# Patient Record
Sex: Female | Born: 1945 | Race: White | Hispanic: No | Marital: Married
Health system: Southern US, Community
[De-identification: ages and names within clinical notes are randomized; demographics above are authoritative.]

---

## 2000-01-30 ENCOUNTER — Other Ambulatory Visit: Admission: RE | Admit: 2000-01-30 | Discharge: 2000-01-30 | Payer: Self-pay | Admitting: Obstetrics and Gynecology

## 2000-09-09 ENCOUNTER — Encounter: Admission: RE | Admit: 2000-09-09 | Discharge: 2000-09-09 | Payer: Self-pay | Admitting: Obstetrics and Gynecology

## 2000-09-09 ENCOUNTER — Encounter: Payer: Self-pay | Admitting: Obstetrics and Gynecology

## 2001-02-24 ENCOUNTER — Other Ambulatory Visit: Admission: RE | Admit: 2001-02-24 | Discharge: 2001-02-24 | Payer: Self-pay | Admitting: Obstetrics and Gynecology

## 2002-05-01 ENCOUNTER — Other Ambulatory Visit: Admission: RE | Admit: 2002-05-01 | Discharge: 2002-05-01 | Payer: Self-pay | Admitting: Gynecology

## 2002-05-09 ENCOUNTER — Encounter: Admission: RE | Admit: 2002-05-09 | Discharge: 2002-05-09 | Payer: Self-pay | Admitting: Gynecology

## 2002-05-09 ENCOUNTER — Encounter: Payer: Self-pay | Admitting: Gynecology

## 2002-05-22 ENCOUNTER — Encounter: Admission: RE | Admit: 2002-05-22 | Discharge: 2002-05-22 | Payer: Self-pay | Admitting: Gynecology

## 2002-05-22 ENCOUNTER — Encounter: Payer: Self-pay | Admitting: Gynecology

## 2002-07-04 ENCOUNTER — Ambulatory Visit (HOSPITAL_COMMUNITY): Admission: RE | Admit: 2002-07-04 | Discharge: 2002-07-04 | Payer: Self-pay | Admitting: *Deleted

## 2003-05-16 ENCOUNTER — Other Ambulatory Visit: Admission: RE | Admit: 2003-05-16 | Discharge: 2003-05-16 | Payer: Self-pay | Admitting: Gynecology

## 2004-06-03 ENCOUNTER — Encounter: Admission: RE | Admit: 2004-06-03 | Discharge: 2004-06-03 | Payer: Self-pay | Admitting: Gynecology

## 2004-06-18 ENCOUNTER — Other Ambulatory Visit: Admission: RE | Admit: 2004-06-18 | Discharge: 2004-06-18 | Payer: Self-pay | Admitting: Gynecology

## 2005-08-18 ENCOUNTER — Other Ambulatory Visit: Admission: RE | Admit: 2005-08-18 | Discharge: 2005-08-18 | Payer: Self-pay | Admitting: Gynecology

## 2006-09-08 ENCOUNTER — Other Ambulatory Visit: Admission: RE | Admit: 2006-09-08 | Discharge: 2006-09-08 | Payer: Self-pay | Admitting: Gynecology

## 2006-09-09 ENCOUNTER — Encounter: Admission: RE | Admit: 2006-09-09 | Discharge: 2006-09-09 | Payer: Self-pay | Admitting: Gynecology

## 2008-01-05 ENCOUNTER — Encounter: Admission: RE | Admit: 2008-01-05 | Discharge: 2008-01-05 | Payer: Self-pay | Admitting: Gynecology

## 2008-01-13 ENCOUNTER — Encounter: Admission: RE | Admit: 2008-01-13 | Discharge: 2008-01-13 | Payer: Self-pay | Admitting: Gynecology

## 2009-07-11 ENCOUNTER — Encounter: Admission: RE | Admit: 2009-07-11 | Discharge: 2009-07-11 | Payer: Self-pay | Admitting: Gynecology

## 2010-12-04 ENCOUNTER — Encounter
Admission: RE | Admit: 2010-12-04 | Discharge: 2010-12-04 | Payer: Self-pay | Source: Home / Self Care | Attending: Gynecology | Admitting: Gynecology

## 2011-04-03 NOTE — Op Note (Signed)
   NAME:  Mallory Lee, Mallory Lee                            ACCOUNT NO.:  [id number]   MEDICAL RECORD NO.:  [id number]                   PATIENT TYPE:  AMB   LOCATION:  ENDO                                 FACILITY:  [facility]   PHYSICIAN:  Georgiana Spinner, M.D.                 DATE OF BIRTH:  September 04, 1946   DATE OF PROCEDURE:  07/04/2002  DATE OF DISCHARGE:                                 OPERATIVE REPORT   PROCEDURE PERFORMED:  Colonoscopy.   ENDOSCOPIST:  Georgiana Spinner, M.D.   INDICATIONS FOR PROCEDURE:  Hemoccult positivity.   ANESTHESIA:  None further given.  See endoscopy note.   PREP:  Visicol tablets.  Result was very good.   DESCRIPTION OF PROCEDURE:  With the patient mildly sedated in the left  lateral decubitus the Olympus video colonoscope was inserted in the rectum  and passed under direct vision to the cecum, identified by the ileocecal  valve and appendiceal orifice, both of which were photographed.  From this  point the colonoscope was slowly withdrawn taking circumferential views of  the entire colonic mucosa stopping only in the rectum which appeared normal  on direct and showed hemorrhoids on retroflex view.  The endoscope was  straightened and withdrawn.  The patient's vital signs and pulse oximeter  remained stable.  The patient tolerated the procedure well without apparent  complications.   FINDINGS:  Internal hemorrhoids.  Otherwise unremarkable examination.   PLAN:  Have patient follow-up with me in five years or as needed.                                               Georgiana Spinner, M.D.    GMO/MEDQ  D:  07/04/2002  T:  07/05/2002  Job:  [id number]   cc:   Gretta Cool, M.D.

## 2011-04-03 NOTE — Op Note (Signed)
   NAME:  Mallory Lee, Mallory Lee                            ACCOUNT NO.:  [id number]   MEDICAL RECORD NO.:  [id number]                   PATIENT TYPE:  AMB   LOCATION:  ENDO                                 FACILITY:  [facility]   PHYSICIAN:  Georgiana Spinner, M.D.                 DATE OF BIRTH:  1946/09/27   DATE OF PROCEDURE:  11/15/2002  DATE OF DISCHARGE:                                 OPERATIVE REPORT   PROCEDURE PERFORMED:  Upper endoscopy.   ENDOSCOPIST:  Georgiana Spinner, M.D.   INDICATIONS FOR PROCEDURE:  Gastroesophageal reflux disease, Hemoccult  positivity.   ANESTHESIA:  Demerol  60 mg, Versed 6 mg.   DESCRIPTION OF PROCEDURE:  With the patient mildly sedated in the left  lateral decubitus position, the Olympus video endoscope was inserted in the  mouth and passed under direct vision through the esophagus which appeared  normal.  There was no evidence of Barrett's.  On entering into the stomach  through a  hiatal hernia, the fundus, body, antrum, duodenal bulb and second  portion of the duodenum all appeared normal.  From this point, the endoscope  was slowly withdrawn taking circumferential views of the entire duodenal  mucosa until the endoscope was pulled back into the stomach and placed on  retroflexion to view the stomach from below.  The endoscope was then  straightened and withdrawn taking circumferential views of the remaining  gastric and esophageal mucosa.  The patient's vital signs and pulse oximeter  remained stable.  The patient tolerated the procedure well without apparent  complications.   FINDINGS:  Hiatal hernia.  Otherwise unremarkable examination.   PLAN:  Proceed to colonoscopy.                                               Georgiana Spinner, M.D.    GMO/MEDQ  D:  07/04/2002  T:  07/05/2002  Job:  [id number]

## 2013-11-02 ENCOUNTER — Other Ambulatory Visit: Payer: Self-pay

## 2013-11-02 DIAGNOSIS — Z1231 Encounter for screening mammogram for malignant neoplasm of breast: Secondary | ICD-10-CM

## 2013-11-14 ENCOUNTER — Ambulatory Visit: Payer: Self-pay

## 2013-11-15 ENCOUNTER — Ambulatory Visit
Admission: RE | Admit: 2013-11-15 | Discharge: 2013-11-15 | Disposition: A | Discharge status: Home / Self Care | Payer: Medicare Other | Source: Ambulatory Visit

## 2013-11-15 DIAGNOSIS — Z1231 Encounter for screening mammogram for malignant neoplasm of breast: Secondary | ICD-10-CM

## 2015-04-22 IMAGING — MG MM DIGITAL SCREENING BILATERAL
4 series · 4 of 4 positions shown · non-contrast
Comparison: Previous exam(s).

CLINICAL DATA: Screening.

EXAM:
DIGITAL SCREENING BILATERAL MAMMOGRAM WITH CAD

[R CC]
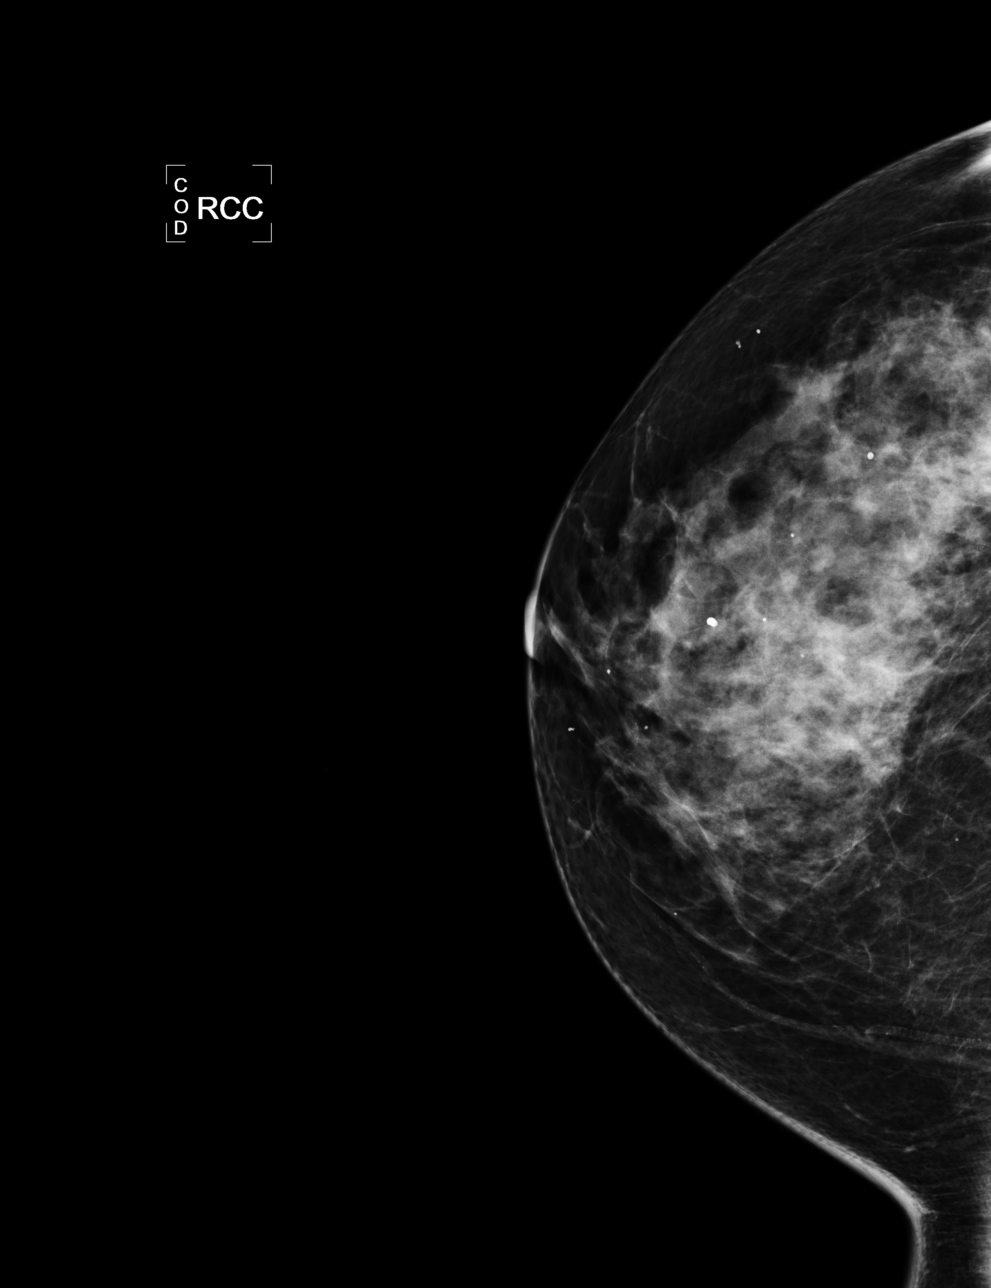

[L CC]
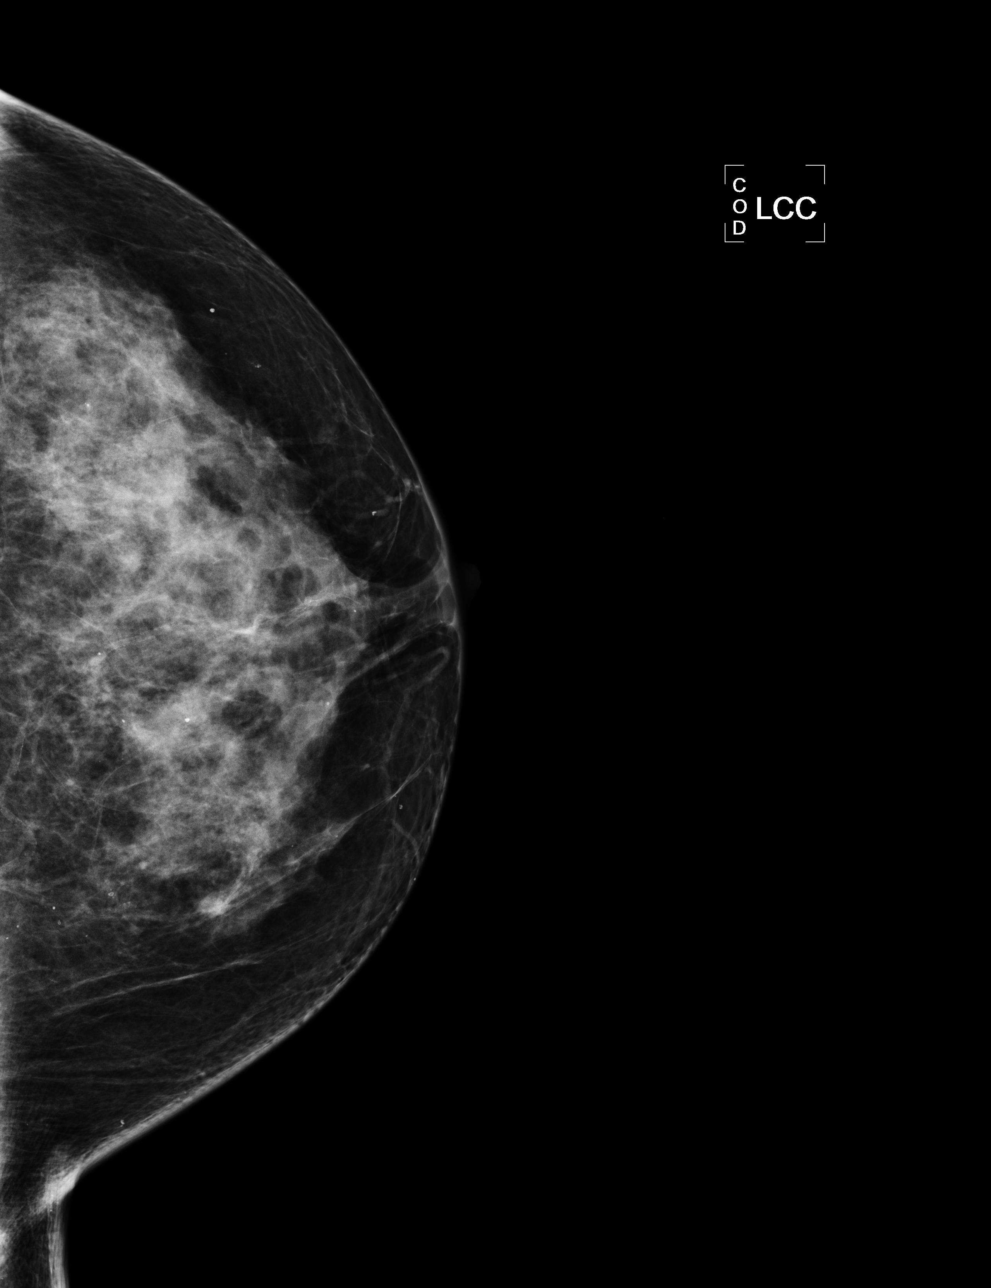

[L MLO]
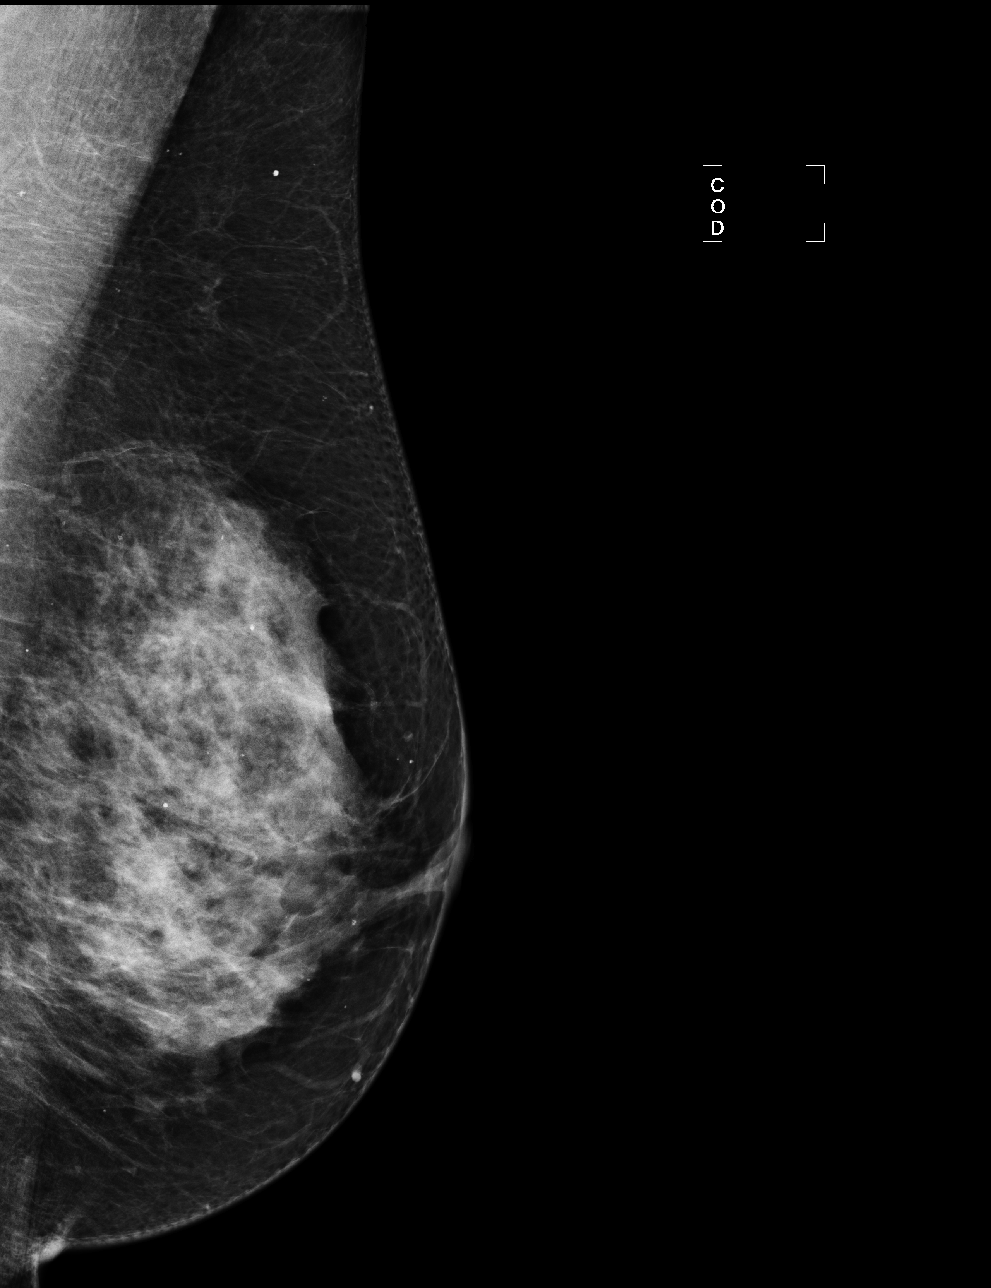

[R MLO]
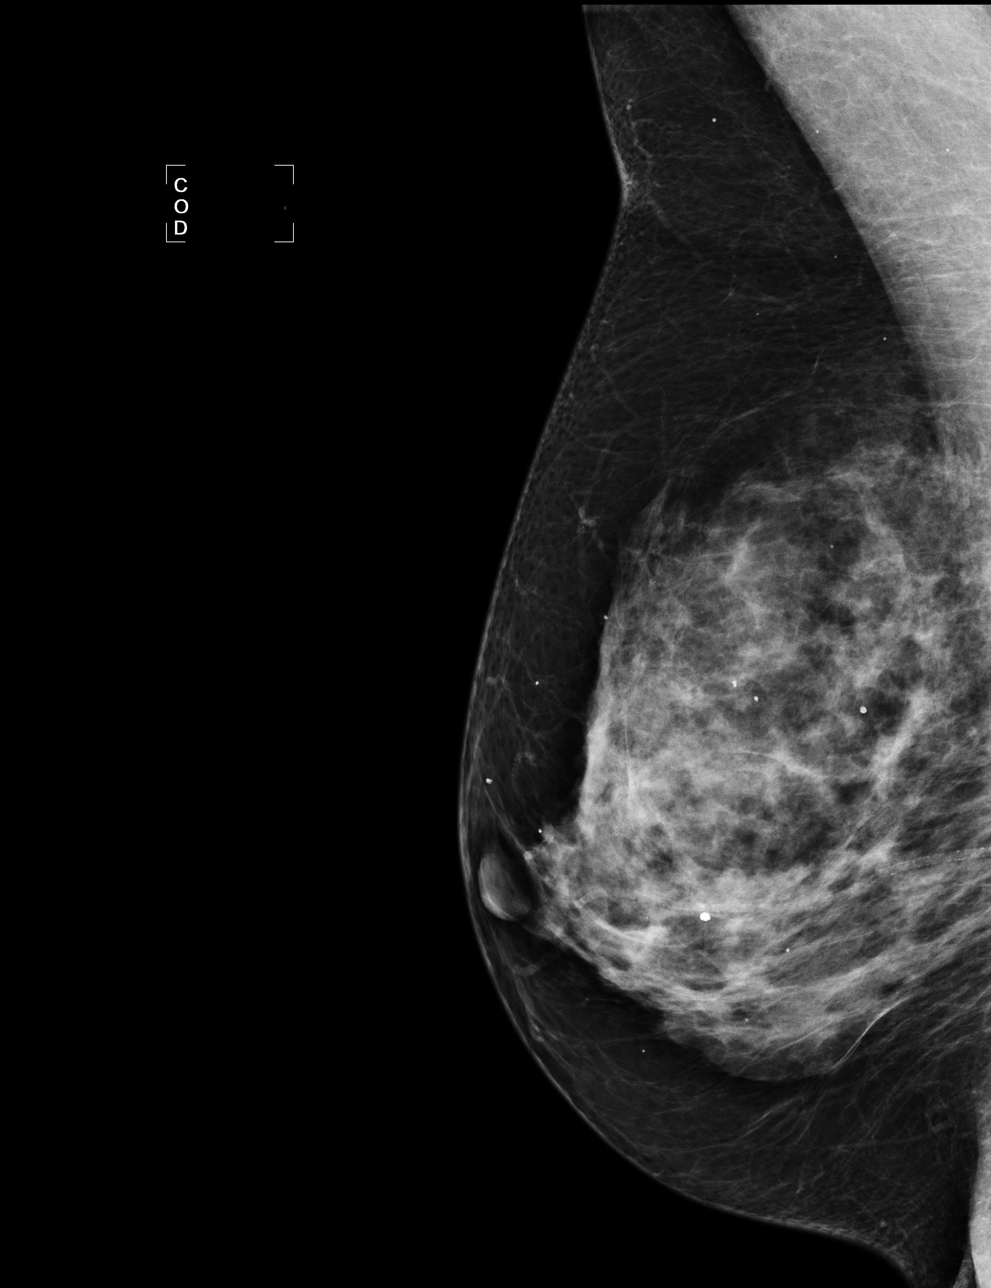

[4 of 4 positions shown; findings below may reference images not displayed]

ACR Breast Density Category d: The breasts are extremely dense,
which lowers the sensitivity of mammography.
FINDINGS: There are no findings suspicious for malignancy. Images were
processed with CAD.
IMPRESSION: No mammographic evidence of malignancy. A result letter of this
screening mammogram will be mailed directly to the patient.

RECOMMENDATION:
Screening mammogram in one year. (Code:[id number]-K-7LZ)

BI-RADS CATEGORY  1: Negative

## 2016-01-15 DIAGNOSIS — L821 Other seborrheic keratosis: Secondary | ICD-10-CM | POA: Diagnosis not present

## 2016-01-15 DIAGNOSIS — D1801 Hemangioma of skin and subcutaneous tissue: Secondary | ICD-10-CM | POA: Diagnosis not present

## 2016-01-15 DIAGNOSIS — L820 Inflamed seborrheic keratosis: Secondary | ICD-10-CM | POA: Diagnosis not present

## 2016-01-15 DIAGNOSIS — L578 Other skin changes due to chronic exposure to nonionizing radiation: Secondary | ICD-10-CM | POA: Diagnosis not present

## 2016-02-11 DIAGNOSIS — L308 Other specified dermatitis: Secondary | ICD-10-CM | POA: Diagnosis not present

## 2016-02-11 DIAGNOSIS — L821 Other seborrheic keratosis: Secondary | ICD-10-CM | POA: Diagnosis not present

## 2016-02-11 DIAGNOSIS — B029 Zoster without complications: Secondary | ICD-10-CM | POA: Diagnosis not present

## 2016-02-17 DIAGNOSIS — E789 Disorder of lipoprotein metabolism, unspecified: Secondary | ICD-10-CM | POA: Diagnosis not present

## 2016-02-17 DIAGNOSIS — F419 Anxiety disorder, unspecified: Secondary | ICD-10-CM | POA: Diagnosis not present

## 2016-02-17 DIAGNOSIS — E032 Hypothyroidism due to medicaments and other exogenous substances: Secondary | ICD-10-CM | POA: Diagnosis not present

## 2016-02-17 DIAGNOSIS — I10 Essential (primary) hypertension: Secondary | ICD-10-CM | POA: Diagnosis not present

## 2016-09-03 DIAGNOSIS — Z4789 Encounter for other orthopedic aftercare: Secondary | ICD-10-CM | POA: Diagnosis not present

## 2016-09-03 DIAGNOSIS — M19041 Primary osteoarthritis, right hand: Secondary | ICD-10-CM | POA: Diagnosis not present

## 2016-09-09 DIAGNOSIS — M25611 Stiffness of right shoulder, not elsewhere classified: Secondary | ICD-10-CM | POA: Diagnosis not present

## 2016-09-11 DIAGNOSIS — M25611 Stiffness of right shoulder, not elsewhere classified: Secondary | ICD-10-CM | POA: Diagnosis not present

## 2016-09-15 DIAGNOSIS — M25611 Stiffness of right shoulder, not elsewhere classified: Secondary | ICD-10-CM | POA: Diagnosis not present

## 2016-09-17 DIAGNOSIS — M25611 Stiffness of right shoulder, not elsewhere classified: Secondary | ICD-10-CM | POA: Diagnosis not present

## 2016-09-22 DIAGNOSIS — M25611 Stiffness of right shoulder, not elsewhere classified: Secondary | ICD-10-CM | POA: Diagnosis not present

## 2016-09-24 DIAGNOSIS — M25611 Stiffness of right shoulder, not elsewhere classified: Secondary | ICD-10-CM | POA: Diagnosis not present

## 2016-09-30 DIAGNOSIS — M25611 Stiffness of right shoulder, not elsewhere classified: Secondary | ICD-10-CM | POA: Diagnosis not present

## 2016-10-01 DIAGNOSIS — M25511 Pain in right shoulder: Secondary | ICD-10-CM | POA: Diagnosis not present

## 2016-10-01 DIAGNOSIS — F419 Anxiety disorder, unspecified: Secondary | ICD-10-CM | POA: Diagnosis not present

## 2016-10-01 DIAGNOSIS — Z4789 Encounter for other orthopedic aftercare: Secondary | ICD-10-CM | POA: Diagnosis not present

## 2016-10-01 DIAGNOSIS — F329 Major depressive disorder, single episode, unspecified: Secondary | ICD-10-CM | POA: Diagnosis not present

## 2016-10-01 DIAGNOSIS — G8929 Other chronic pain: Secondary | ICD-10-CM | POA: Diagnosis not present

## 2016-10-01 DIAGNOSIS — J329 Chronic sinusitis, unspecified: Secondary | ICD-10-CM | POA: Diagnosis not present

## 2016-10-02 DIAGNOSIS — M25611 Stiffness of right shoulder, not elsewhere classified: Secondary | ICD-10-CM | POA: Diagnosis not present

## 2016-10-15 DIAGNOSIS — M25611 Stiffness of right shoulder, not elsewhere classified: Secondary | ICD-10-CM | POA: Diagnosis not present

## 2016-10-20 DIAGNOSIS — M25611 Stiffness of right shoulder, not elsewhere classified: Secondary | ICD-10-CM | POA: Diagnosis not present

## 2016-10-22 DIAGNOSIS — M25611 Stiffness of right shoulder, not elsewhere classified: Secondary | ICD-10-CM | POA: Diagnosis not present

## 2016-10-26 DIAGNOSIS — M25611 Stiffness of right shoulder, not elsewhere classified: Secondary | ICD-10-CM | POA: Diagnosis not present

## 2016-10-28 DIAGNOSIS — M25611 Stiffness of right shoulder, not elsewhere classified: Secondary | ICD-10-CM | POA: Diagnosis not present

## 2016-11-05 DIAGNOSIS — M25611 Stiffness of right shoulder, not elsewhere classified: Secondary | ICD-10-CM | POA: Diagnosis not present

## 2016-11-17 DIAGNOSIS — I10 Essential (primary) hypertension: Secondary | ICD-10-CM | POA: Diagnosis not present

## 2016-11-17 DIAGNOSIS — E032 Hypothyroidism due to medicaments and other exogenous substances: Secondary | ICD-10-CM | POA: Diagnosis not present

## 2016-11-17 DIAGNOSIS — F419 Anxiety disorder, unspecified: Secondary | ICD-10-CM | POA: Diagnosis not present

## 2016-11-17 DIAGNOSIS — E789 Disorder of lipoprotein metabolism, unspecified: Secondary | ICD-10-CM | POA: Diagnosis not present

## 2017-01-14 DIAGNOSIS — L814 Other melanin hyperpigmentation: Secondary | ICD-10-CM | POA: Diagnosis not present

## 2017-01-14 DIAGNOSIS — L820 Inflamed seborrheic keratosis: Secondary | ICD-10-CM | POA: Diagnosis not present

## 2017-01-14 DIAGNOSIS — D1801 Hemangioma of skin and subcutaneous tissue: Secondary | ICD-10-CM | POA: Diagnosis not present

## 2017-02-25 DIAGNOSIS — E789 Disorder of lipoprotein metabolism, unspecified: Secondary | ICD-10-CM | POA: Diagnosis not present

## 2017-02-25 DIAGNOSIS — I10 Essential (primary) hypertension: Secondary | ICD-10-CM | POA: Diagnosis not present

## 2017-02-25 DIAGNOSIS — E032 Hypothyroidism due to medicaments and other exogenous substances: Secondary | ICD-10-CM | POA: Diagnosis not present

## 2017-03-11 DIAGNOSIS — E789 Disorder of lipoprotein metabolism, unspecified: Secondary | ICD-10-CM | POA: Diagnosis not present

## 2017-03-11 DIAGNOSIS — E559 Vitamin D deficiency, unspecified: Secondary | ICD-10-CM | POA: Diagnosis not present

## 2017-03-11 DIAGNOSIS — Z0000 Encounter for general adult medical examination without abnormal findings: Secondary | ICD-10-CM | POA: Diagnosis not present

## 2017-03-11 DIAGNOSIS — I10 Essential (primary) hypertension: Secondary | ICD-10-CM | POA: Diagnosis not present

## 2017-03-11 DIAGNOSIS — E0789 Other specified disorders of thyroid: Secondary | ICD-10-CM | POA: Diagnosis not present

## 2017-03-11 DIAGNOSIS — Z79899 Other long term (current) drug therapy: Secondary | ICD-10-CM | POA: Diagnosis not present

## 2017-04-08 DIAGNOSIS — J069 Acute upper respiratory infection, unspecified: Secondary | ICD-10-CM | POA: Diagnosis not present

## 2017-04-08 DIAGNOSIS — B9789 Other viral agents as the cause of diseases classified elsewhere: Secondary | ICD-10-CM | POA: Diagnosis not present

## 2017-06-28 DIAGNOSIS — Z23 Encounter for immunization: Secondary | ICD-10-CM | POA: Diagnosis not present

## 2017-06-28 DIAGNOSIS — E039 Hypothyroidism, unspecified: Secondary | ICD-10-CM | POA: Diagnosis not present

## 2017-06-28 DIAGNOSIS — E559 Vitamin D deficiency, unspecified: Secondary | ICD-10-CM | POA: Diagnosis not present

## 2017-06-28 DIAGNOSIS — F419 Anxiety disorder, unspecified: Secondary | ICD-10-CM | POA: Diagnosis not present

## 2017-06-28 DIAGNOSIS — I10 Essential (primary) hypertension: Secondary | ICD-10-CM | POA: Diagnosis not present

## 2017-06-28 DIAGNOSIS — E7800 Pure hypercholesterolemia, unspecified: Secondary | ICD-10-CM | POA: Diagnosis not present

## 2017-07-20 DIAGNOSIS — Z01419 Encounter for gynecological examination (general) (routine) without abnormal findings: Secondary | ICD-10-CM | POA: Diagnosis not present

## 2017-07-20 DIAGNOSIS — Z780 Asymptomatic menopausal state: Secondary | ICD-10-CM | POA: Diagnosis not present

## 2017-07-20 DIAGNOSIS — Z79890 Hormone replacement therapy (postmenopausal): Secondary | ICD-10-CM | POA: Diagnosis not present

## 2017-07-20 DIAGNOSIS — Z1231 Encounter for screening mammogram for malignant neoplasm of breast: Secondary | ICD-10-CM | POA: Diagnosis not present

## 2017-08-23 DIAGNOSIS — Z1211 Encounter for screening for malignant neoplasm of colon: Secondary | ICD-10-CM | POA: Diagnosis not present

## 2017-08-23 DIAGNOSIS — D126 Benign neoplasm of colon, unspecified: Secondary | ICD-10-CM | POA: Diagnosis not present

## 2017-08-23 DIAGNOSIS — K640 First degree hemorrhoids: Secondary | ICD-10-CM | POA: Diagnosis not present

## 2017-08-23 DIAGNOSIS — K6389 Other specified diseases of intestine: Secondary | ICD-10-CM | POA: Diagnosis not present

## 2017-08-23 DIAGNOSIS — K5730 Diverticulosis of large intestine without perforation or abscess without bleeding: Secondary | ICD-10-CM | POA: Diagnosis not present

## 2017-08-26 DIAGNOSIS — D126 Benign neoplasm of colon, unspecified: Secondary | ICD-10-CM | POA: Diagnosis not present

## 2017-12-09 DIAGNOSIS — L820 Inflamed seborrheic keratosis: Secondary | ICD-10-CM | POA: Diagnosis not present

## 2017-12-09 DIAGNOSIS — C441191 Basal cell carcinoma of skin of left upper eyelid, including canthus: Secondary | ICD-10-CM | POA: Diagnosis not present

## 2017-12-09 DIAGNOSIS — R233 Spontaneous ecchymoses: Secondary | ICD-10-CM | POA: Diagnosis not present

## 2017-12-24 DIAGNOSIS — E7800 Pure hypercholesterolemia, unspecified: Secondary | ICD-10-CM | POA: Diagnosis not present

## 2017-12-24 DIAGNOSIS — E559 Vitamin D deficiency, unspecified: Secondary | ICD-10-CM | POA: Diagnosis not present

## 2017-12-24 DIAGNOSIS — Z114 Encounter for screening for human immunodeficiency virus [HIV]: Secondary | ICD-10-CM | POA: Diagnosis not present

## 2017-12-29 ENCOUNTER — Other Ambulatory Visit (HOSPITAL_COMMUNITY): Payer: Self-pay | Admitting: Respiratory Therapy

## 2017-12-29 DIAGNOSIS — R05 Cough: Secondary | ICD-10-CM

## 2017-12-29 DIAGNOSIS — Z23 Encounter for immunization: Secondary | ICD-10-CM | POA: Diagnosis not present

## 2017-12-29 DIAGNOSIS — R059 Cough, unspecified: Secondary | ICD-10-CM

## 2017-12-29 DIAGNOSIS — I10 Essential (primary) hypertension: Secondary | ICD-10-CM | POA: Diagnosis not present

## 2018-01-07 ENCOUNTER — Inpatient Hospital Stay (HOSPITAL_COMMUNITY): Admission: RE | Admit: 2018-01-07 | Payer: Self-pay | Source: Ambulatory Visit

## 2018-03-30 DIAGNOSIS — L578 Other skin changes due to chronic exposure to nonionizing radiation: Secondary | ICD-10-CM | POA: Diagnosis not present

## 2018-03-30 DIAGNOSIS — L820 Inflamed seborrheic keratosis: Secondary | ICD-10-CM | POA: Diagnosis not present

## 2018-03-30 DIAGNOSIS — D1801 Hemangioma of skin and subcutaneous tissue: Secondary | ICD-10-CM | POA: Diagnosis not present

## 2018-03-30 DIAGNOSIS — L814 Other melanin hyperpigmentation: Secondary | ICD-10-CM | POA: Diagnosis not present

## 2018-07-06 DIAGNOSIS — E7800 Pure hypercholesterolemia, unspecified: Secondary | ICD-10-CM | POA: Diagnosis not present

## 2018-07-06 DIAGNOSIS — I10 Essential (primary) hypertension: Secondary | ICD-10-CM | POA: Diagnosis not present

## 2018-07-13 DIAGNOSIS — Z86010 Personal history of colonic polyps: Secondary | ICD-10-CM | POA: Diagnosis not present

## 2018-07-13 DIAGNOSIS — Z0000 Encounter for general adult medical examination without abnormal findings: Secondary | ICD-10-CM | POA: Diagnosis not present

## 2018-07-13 DIAGNOSIS — Z23 Encounter for immunization: Secondary | ICD-10-CM | POA: Diagnosis not present

## 2018-07-13 DIAGNOSIS — Z780 Asymptomatic menopausal state: Secondary | ICD-10-CM | POA: Diagnosis not present

## 2018-07-13 DIAGNOSIS — I10 Essential (primary) hypertension: Secondary | ICD-10-CM | POA: Diagnosis not present

## 2018-07-13 DIAGNOSIS — M810 Age-related osteoporosis without current pathological fracture: Secondary | ICD-10-CM | POA: Diagnosis not present

## 2018-07-13 DIAGNOSIS — E039 Hypothyroidism, unspecified: Secondary | ICD-10-CM | POA: Diagnosis not present

## 2018-07-13 DIAGNOSIS — F419 Anxiety disorder, unspecified: Secondary | ICD-10-CM | POA: Diagnosis not present

## 2018-07-13 DIAGNOSIS — E789 Disorder of lipoprotein metabolism, unspecified: Secondary | ICD-10-CM | POA: Diagnosis not present

## 2018-07-13 DIAGNOSIS — E7800 Pure hypercholesterolemia, unspecified: Secondary | ICD-10-CM | POA: Diagnosis not present

## 2018-11-15 DIAGNOSIS — L820 Inflamed seborrheic keratosis: Secondary | ICD-10-CM | POA: Diagnosis not present

## 2018-11-15 DIAGNOSIS — L821 Other seborrheic keratosis: Secondary | ICD-10-CM | POA: Diagnosis not present

## 2018-11-15 DIAGNOSIS — L578 Other skin changes due to chronic exposure to nonionizing radiation: Secondary | ICD-10-CM | POA: Diagnosis not present

## 2018-12-29 DIAGNOSIS — I10 Essential (primary) hypertension: Secondary | ICD-10-CM | POA: Diagnosis not present

## 2019-03-06 DIAGNOSIS — J018 Other acute sinusitis: Secondary | ICD-10-CM | POA: Diagnosis not present

## 2019-04-26 DIAGNOSIS — J3489 Other specified disorders of nose and nasal sinuses: Secondary | ICD-10-CM | POA: Diagnosis not present

## 2019-04-26 DIAGNOSIS — R05 Cough: Secondary | ICD-10-CM | POA: Diagnosis not present

## 2019-04-26 DIAGNOSIS — J392 Other diseases of pharynx: Secondary | ICD-10-CM | POA: Diagnosis not present

## 2019-04-26 DIAGNOSIS — J040 Acute laryngitis: Secondary | ICD-10-CM | POA: Diagnosis not present

## 2019-04-26 DIAGNOSIS — Z03818 Encounter for observation for suspected exposure to other biological agents ruled out: Secondary | ICD-10-CM | POA: Diagnosis not present

## 2019-04-26 DIAGNOSIS — R0981 Nasal congestion: Secondary | ICD-10-CM | POA: Diagnosis not present

## 2019-06-21 DIAGNOSIS — H9202 Otalgia, left ear: Secondary | ICD-10-CM | POA: Diagnosis not present

## 2019-06-21 DIAGNOSIS — J040 Acute laryngitis: Secondary | ICD-10-CM | POA: Diagnosis not present

## 2019-07-17 DIAGNOSIS — I10 Essential (primary) hypertension: Secondary | ICD-10-CM | POA: Diagnosis not present

## 2019-07-17 DIAGNOSIS — E032 Hypothyroidism due to medicaments and other exogenous substances: Secondary | ICD-10-CM | POA: Diagnosis not present

## 2019-07-17 DIAGNOSIS — E7800 Pure hypercholesterolemia, unspecified: Secondary | ICD-10-CM | POA: Diagnosis not present

## 2019-07-20 DIAGNOSIS — S92425A Nondisplaced fracture of distal phalanx of left great toe, initial encounter for closed fracture: Secondary | ICD-10-CM | POA: Diagnosis not present

## 2019-07-20 DIAGNOSIS — I10 Essential (primary) hypertension: Secondary | ICD-10-CM | POA: Diagnosis not present

## 2019-07-20 DIAGNOSIS — Z0001 Encounter for general adult medical examination with abnormal findings: Secondary | ICD-10-CM | POA: Diagnosis not present

## 2019-07-20 DIAGNOSIS — Z86010 Personal history of colonic polyps: Secondary | ICD-10-CM | POA: Diagnosis not present

## 2019-07-20 DIAGNOSIS — E039 Hypothyroidism, unspecified: Secondary | ICD-10-CM | POA: Diagnosis not present

## 2019-07-20 DIAGNOSIS — E789 Disorder of lipoprotein metabolism, unspecified: Secondary | ICD-10-CM | POA: Diagnosis not present

## 2019-07-20 DIAGNOSIS — M79671 Pain in right foot: Secondary | ICD-10-CM | POA: Diagnosis not present

## 2019-07-20 DIAGNOSIS — Z23 Encounter for immunization: Secondary | ICD-10-CM | POA: Diagnosis not present

## 2019-08-02 DIAGNOSIS — S99922A Unspecified injury of left foot, initial encounter: Secondary | ICD-10-CM | POA: Diagnosis not present

## 2019-08-03 DIAGNOSIS — I10 Essential (primary) hypertension: Secondary | ICD-10-CM | POA: Diagnosis not present

## 2019-09-12 DIAGNOSIS — L820 Inflamed seborrheic keratosis: Secondary | ICD-10-CM | POA: Diagnosis not present

## 2019-09-12 DIAGNOSIS — D1801 Hemangioma of skin and subcutaneous tissue: Secondary | ICD-10-CM | POA: Diagnosis not present

## 2019-09-12 DIAGNOSIS — R233 Spontaneous ecchymoses: Secondary | ICD-10-CM | POA: Diagnosis not present

## 2019-09-27 DIAGNOSIS — M12811 Other specific arthropathies, not elsewhere classified, right shoulder: Secondary | ICD-10-CM | POA: Diagnosis not present

## 2019-09-27 DIAGNOSIS — M25511 Pain in right shoulder: Secondary | ICD-10-CM | POA: Diagnosis not present

## 2019-10-02 DIAGNOSIS — R52 Pain, unspecified: Secondary | ICD-10-CM | POA: Diagnosis not present

## 2019-12-22 DIAGNOSIS — I10 Essential (primary) hypertension: Secondary | ICD-10-CM | POA: Diagnosis not present

## 2019-12-22 DIAGNOSIS — Z20828 Contact with and (suspected) exposure to other viral communicable diseases: Secondary | ICD-10-CM | POA: Diagnosis not present

## 2019-12-22 DIAGNOSIS — Z03818 Encounter for observation for suspected exposure to other biological agents ruled out: Secondary | ICD-10-CM | POA: Diagnosis not present

## 2020-01-10 DIAGNOSIS — E789 Disorder of lipoprotein metabolism, unspecified: Secondary | ICD-10-CM | POA: Diagnosis not present

## 2020-01-11 DIAGNOSIS — R42 Dizziness and giddiness: Secondary | ICD-10-CM | POA: Diagnosis not present

## 2020-02-29 DIAGNOSIS — L720 Epidermal cyst: Secondary | ICD-10-CM | POA: Diagnosis not present

## 2020-02-29 DIAGNOSIS — L918 Other hypertrophic disorders of the skin: Secondary | ICD-10-CM | POA: Diagnosis not present

## 2020-02-29 DIAGNOSIS — D1801 Hemangioma of skin and subcutaneous tissue: Secondary | ICD-10-CM | POA: Diagnosis not present

## 2020-02-29 DIAGNOSIS — L820 Inflamed seborrheic keratosis: Secondary | ICD-10-CM | POA: Diagnosis not present

## 2020-05-23 DIAGNOSIS — Z79890 Hormone replacement therapy (postmenopausal): Secondary | ICD-10-CM | POA: Diagnosis not present

## 2020-05-23 DIAGNOSIS — N951 Menopausal and female climacteric states: Secondary | ICD-10-CM | POA: Diagnosis not present

## 2020-05-23 DIAGNOSIS — Z01419 Encounter for gynecological examination (general) (routine) without abnormal findings: Secondary | ICD-10-CM | POA: Diagnosis not present

## 2020-05-23 DIAGNOSIS — Z1231 Encounter for screening mammogram for malignant neoplasm of breast: Secondary | ICD-10-CM | POA: Diagnosis not present

## 2020-07-25 DIAGNOSIS — E032 Hypothyroidism due to medicaments and other exogenous substances: Secondary | ICD-10-CM | POA: Diagnosis not present

## 2020-07-25 DIAGNOSIS — I10 Essential (primary) hypertension: Secondary | ICD-10-CM | POA: Diagnosis not present

## 2020-07-25 DIAGNOSIS — E789 Disorder of lipoprotein metabolism, unspecified: Secondary | ICD-10-CM | POA: Diagnosis not present

## 2020-08-01 DIAGNOSIS — E032 Hypothyroidism due to medicaments and other exogenous substances: Secondary | ICD-10-CM | POA: Diagnosis not present

## 2020-08-01 DIAGNOSIS — H43399 Other vitreous opacities, unspecified eye: Secondary | ICD-10-CM | POA: Diagnosis not present

## 2020-08-01 DIAGNOSIS — E789 Disorder of lipoprotein metabolism, unspecified: Secondary | ICD-10-CM | POA: Diagnosis not present

## 2020-08-01 DIAGNOSIS — E039 Hypothyroidism, unspecified: Secondary | ICD-10-CM | POA: Diagnosis not present

## 2020-08-01 DIAGNOSIS — Z0001 Encounter for general adult medical examination with abnormal findings: Secondary | ICD-10-CM | POA: Diagnosis not present

## 2020-08-01 DIAGNOSIS — I10 Essential (primary) hypertension: Secondary | ICD-10-CM | POA: Diagnosis not present

## 2020-08-01 DIAGNOSIS — Z23 Encounter for immunization: Secondary | ICD-10-CM | POA: Diagnosis not present

## 2020-08-01 DIAGNOSIS — R159 Full incontinence of feces: Secondary | ICD-10-CM | POA: Diagnosis not present

## 2020-08-01 DIAGNOSIS — M810 Age-related osteoporosis without current pathological fracture: Secondary | ICD-10-CM | POA: Diagnosis not present

## 2020-08-26 DIAGNOSIS — R42 Dizziness and giddiness: Secondary | ICD-10-CM | POA: Diagnosis not present

## 2020-09-06 DIAGNOSIS — J069 Acute upper respiratory infection, unspecified: Secondary | ICD-10-CM | POA: Diagnosis not present

## 2020-09-06 DIAGNOSIS — R197 Diarrhea, unspecified: Secondary | ICD-10-CM | POA: Diagnosis not present

## 2020-09-06 DIAGNOSIS — Z20822 Contact with and (suspected) exposure to covid-19: Secondary | ICD-10-CM | POA: Diagnosis not present

## 2020-09-25 DIAGNOSIS — E789 Disorder of lipoprotein metabolism, unspecified: Secondary | ICD-10-CM | POA: Diagnosis not present

## 2020-09-25 DIAGNOSIS — I10 Essential (primary) hypertension: Secondary | ICD-10-CM | POA: Diagnosis not present

## 2020-09-25 DIAGNOSIS — J45901 Unspecified asthma with (acute) exacerbation: Secondary | ICD-10-CM | POA: Diagnosis not present

## 2020-10-16 DIAGNOSIS — I10 Essential (primary) hypertension: Secondary | ICD-10-CM | POA: Diagnosis not present

## 2020-12-06 DIAGNOSIS — J45909 Unspecified asthma, uncomplicated: Secondary | ICD-10-CM | POA: Diagnosis not present

## 2020-12-06 DIAGNOSIS — Z20822 Contact with and (suspected) exposure to covid-19: Secondary | ICD-10-CM | POA: Diagnosis not present

## 2020-12-06 DIAGNOSIS — I10 Essential (primary) hypertension: Secondary | ICD-10-CM | POA: Diagnosis not present

## 2020-12-06 DIAGNOSIS — Z888 Allergy status to other drugs, medicaments and biological substances status: Secondary | ICD-10-CM | POA: Diagnosis not present

## 2020-12-06 DIAGNOSIS — Z9849 Cataract extraction status, unspecified eye: Secondary | ICD-10-CM | POA: Diagnosis not present

## 2020-12-06 DIAGNOSIS — K810 Acute cholecystitis: Secondary | ICD-10-CM | POA: Diagnosis not present

## 2020-12-06 DIAGNOSIS — K819 Cholecystitis, unspecified: Secondary | ICD-10-CM | POA: Diagnosis not present

## 2020-12-06 DIAGNOSIS — K8010 Calculus of gallbladder with chronic cholecystitis without obstruction: Secondary | ICD-10-CM | POA: Diagnosis not present

## 2020-12-06 DIAGNOSIS — Z885 Allergy status to narcotic agent status: Secondary | ICD-10-CM | POA: Diagnosis not present

## 2020-12-06 DIAGNOSIS — E669 Obesity, unspecified: Secondary | ICD-10-CM | POA: Diagnosis not present

## 2020-12-06 DIAGNOSIS — R079 Chest pain, unspecified: Secondary | ICD-10-CM | POA: Diagnosis not present

## 2020-12-06 DIAGNOSIS — K5900 Constipation, unspecified: Secondary | ICD-10-CM | POA: Diagnosis not present

## 2020-12-06 DIAGNOSIS — Z9889 Other specified postprocedural states: Secondary | ICD-10-CM | POA: Diagnosis not present

## 2020-12-06 DIAGNOSIS — Z90710 Acquired absence of both cervix and uterus: Secondary | ICD-10-CM | POA: Diagnosis not present

## 2020-12-06 DIAGNOSIS — R109 Unspecified abdominal pain: Secondary | ICD-10-CM | POA: Diagnosis not present

## 2020-12-06 DIAGNOSIS — K828 Other specified diseases of gallbladder: Secondary | ICD-10-CM | POA: Diagnosis not present

## 2020-12-06 DIAGNOSIS — R7989 Other specified abnormal findings of blood chemistry: Secondary | ICD-10-CM | POA: Diagnosis not present

## 2020-12-06 DIAGNOSIS — K8020 Calculus of gallbladder without cholecystitis without obstruction: Secondary | ICD-10-CM | POA: Diagnosis not present

## 2021-01-03 DIAGNOSIS — R1030 Lower abdominal pain, unspecified: Secondary | ICD-10-CM | POA: Diagnosis not present

## 2021-01-03 DIAGNOSIS — Z9049 Acquired absence of other specified parts of digestive tract: Secondary | ICD-10-CM | POA: Diagnosis not present

## 2021-01-05 DIAGNOSIS — I10 Essential (primary) hypertension: Secondary | ICD-10-CM | POA: Diagnosis not present

## 2021-01-05 DIAGNOSIS — R1032 Left lower quadrant pain: Secondary | ICD-10-CM | POA: Diagnosis not present

## 2021-01-05 DIAGNOSIS — R5383 Other fatigue: Secondary | ICD-10-CM | POA: Diagnosis not present

## 2021-01-05 DIAGNOSIS — Z885 Allergy status to narcotic agent status: Secondary | ICD-10-CM | POA: Diagnosis not present

## 2021-01-05 DIAGNOSIS — R110 Nausea: Secondary | ICD-10-CM | POA: Diagnosis not present

## 2021-01-05 DIAGNOSIS — K449 Diaphragmatic hernia without obstruction or gangrene: Secondary | ICD-10-CM | POA: Diagnosis not present

## 2021-01-05 DIAGNOSIS — Z87891 Personal history of nicotine dependence: Secondary | ICD-10-CM | POA: Diagnosis not present

## 2021-01-05 DIAGNOSIS — Z882 Allergy status to sulfonamides status: Secondary | ICD-10-CM | POA: Diagnosis not present

## 2021-01-05 DIAGNOSIS — Z880 Allergy status to penicillin: Secondary | ICD-10-CM | POA: Diagnosis not present

## 2021-01-05 DIAGNOSIS — Z79899 Other long term (current) drug therapy: Secondary | ICD-10-CM | POA: Diagnosis not present

## 2021-01-05 DIAGNOSIS — Z9049 Acquired absence of other specified parts of digestive tract: Secondary | ICD-10-CM | POA: Diagnosis not present

## 2021-01-05 DIAGNOSIS — K5732 Diverticulitis of large intestine without perforation or abscess without bleeding: Secondary | ICD-10-CM | POA: Diagnosis not present

## 2021-01-22 DIAGNOSIS — D1801 Hemangioma of skin and subcutaneous tissue: Secondary | ICD-10-CM | POA: Diagnosis not present

## 2021-01-22 DIAGNOSIS — L578 Other skin changes due to chronic exposure to nonionizing radiation: Secondary | ICD-10-CM | POA: Diagnosis not present

## 2021-01-22 DIAGNOSIS — L820 Inflamed seborrheic keratosis: Secondary | ICD-10-CM | POA: Diagnosis not present

## 2021-01-22 DIAGNOSIS — L814 Other melanin hyperpigmentation: Secondary | ICD-10-CM | POA: Diagnosis not present

## 2021-03-05 DIAGNOSIS — R933 Abnormal findings on diagnostic imaging of other parts of digestive tract: Secondary | ICD-10-CM | POA: Diagnosis not present

## 2021-03-05 DIAGNOSIS — K5792 Diverticulitis of intestine, part unspecified, without perforation or abscess without bleeding: Secondary | ICD-10-CM | POA: Diagnosis not present

## 2021-04-11 DIAGNOSIS — R42 Dizziness and giddiness: Secondary | ICD-10-CM | POA: Diagnosis not present

## 2021-04-22 DIAGNOSIS — K5732 Diverticulitis of large intestine without perforation or abscess without bleeding: Secondary | ICD-10-CM | POA: Diagnosis not present

## 2021-04-22 DIAGNOSIS — K3189 Other diseases of stomach and duodenum: Secondary | ICD-10-CM | POA: Diagnosis not present

## 2021-04-22 DIAGNOSIS — K2950 Unspecified chronic gastritis without bleeding: Secondary | ICD-10-CM | POA: Diagnosis not present

## 2021-04-22 DIAGNOSIS — R933 Abnormal findings on diagnostic imaging of other parts of digestive tract: Secondary | ICD-10-CM | POA: Diagnosis not present

## 2021-04-22 DIAGNOSIS — K648 Other hemorrhoids: Secondary | ICD-10-CM | POA: Diagnosis not present

## 2021-04-22 DIAGNOSIS — K31A11 Gastric intestinal metaplasia without dysplasia, involving the antrum: Secondary | ICD-10-CM | POA: Diagnosis not present

## 2021-05-13 DIAGNOSIS — L578 Other skin changes due to chronic exposure to nonionizing radiation: Secondary | ICD-10-CM | POA: Diagnosis not present

## 2021-05-13 DIAGNOSIS — D225 Melanocytic nevi of trunk: Secondary | ICD-10-CM | POA: Diagnosis not present

## 2021-05-13 DIAGNOSIS — D1801 Hemangioma of skin and subcutaneous tissue: Secondary | ICD-10-CM | POA: Diagnosis not present

## 2021-05-13 DIAGNOSIS — L820 Inflamed seborrheic keratosis: Secondary | ICD-10-CM | POA: Diagnosis not present

## 2021-05-15 DIAGNOSIS — M810 Age-related osteoporosis without current pathological fracture: Secondary | ICD-10-CM | POA: Diagnosis not present

## 2021-05-15 DIAGNOSIS — E039 Hypothyroidism, unspecified: Secondary | ICD-10-CM | POA: Diagnosis not present

## 2021-05-15 DIAGNOSIS — I10 Essential (primary) hypertension: Secondary | ICD-10-CM | POA: Diagnosis not present

## 2021-06-06 DIAGNOSIS — Z20822 Contact with and (suspected) exposure to covid-19: Secondary | ICD-10-CM | POA: Diagnosis not present

## 2021-06-06 DIAGNOSIS — J069 Acute upper respiratory infection, unspecified: Secondary | ICD-10-CM | POA: Diagnosis not present

## 2021-06-15 DIAGNOSIS — M810 Age-related osteoporosis without current pathological fracture: Secondary | ICD-10-CM | POA: Diagnosis not present

## 2021-06-15 DIAGNOSIS — E039 Hypothyroidism, unspecified: Secondary | ICD-10-CM | POA: Diagnosis not present

## 2021-06-15 DIAGNOSIS — I10 Essential (primary) hypertension: Secondary | ICD-10-CM | POA: Diagnosis not present

## 2021-06-19 DIAGNOSIS — J069 Acute upper respiratory infection, unspecified: Secondary | ICD-10-CM | POA: Diagnosis not present

## 2021-10-01 DIAGNOSIS — M810 Age-related osteoporosis without current pathological fracture: Secondary | ICD-10-CM | POA: Diagnosis not present

## 2021-10-01 DIAGNOSIS — I10 Essential (primary) hypertension: Secondary | ICD-10-CM | POA: Diagnosis not present

## 2021-10-01 DIAGNOSIS — E789 Disorder of lipoprotein metabolism, unspecified: Secondary | ICD-10-CM | POA: Diagnosis not present

## 2021-10-01 DIAGNOSIS — E032 Hypothyroidism due to medicaments and other exogenous substances: Secondary | ICD-10-CM | POA: Diagnosis not present

## 2021-10-14 DIAGNOSIS — J45901 Unspecified asthma with (acute) exacerbation: Secondary | ICD-10-CM | POA: Diagnosis not present

## 2021-10-14 DIAGNOSIS — Z0000 Encounter for general adult medical examination without abnormal findings: Secondary | ICD-10-CM | POA: Diagnosis not present

## 2021-10-14 DIAGNOSIS — E789 Disorder of lipoprotein metabolism, unspecified: Secondary | ICD-10-CM | POA: Diagnosis not present

## 2021-10-14 DIAGNOSIS — R4589 Other symptoms and signs involving emotional state: Secondary | ICD-10-CM | POA: Diagnosis not present

## 2021-10-14 DIAGNOSIS — Z23 Encounter for immunization: Secondary | ICD-10-CM | POA: Diagnosis not present

## 2021-10-14 DIAGNOSIS — M810 Age-related osteoporosis without current pathological fracture: Secondary | ICD-10-CM | POA: Diagnosis not present

## 2021-10-14 DIAGNOSIS — E039 Hypothyroidism, unspecified: Secondary | ICD-10-CM | POA: Diagnosis not present

## 2021-10-14 DIAGNOSIS — N39498 Other specified urinary incontinence: Secondary | ICD-10-CM | POA: Diagnosis not present

## 2021-10-14 DIAGNOSIS — I10 Essential (primary) hypertension: Secondary | ICD-10-CM | POA: Diagnosis not present

## 2021-12-15 DIAGNOSIS — Z2821 Immunization not carried out because of patient refusal: Secondary | ICD-10-CM | POA: Diagnosis not present

## 2021-12-15 DIAGNOSIS — M810 Age-related osteoporosis without current pathological fracture: Secondary | ICD-10-CM | POA: Diagnosis not present

## 2021-12-15 DIAGNOSIS — M8589 Other specified disorders of bone density and structure, multiple sites: Secondary | ICD-10-CM | POA: Diagnosis not present

## 2021-12-15 DIAGNOSIS — G8929 Other chronic pain: Secondary | ICD-10-CM | POA: Diagnosis not present

## 2021-12-15 DIAGNOSIS — M25511 Pain in right shoulder: Secondary | ICD-10-CM | POA: Diagnosis not present

## 2021-12-22 ENCOUNTER — Other Ambulatory Visit: Payer: Self-pay

## 2021-12-22 ENCOUNTER — Ambulatory Visit: Payer: Self-pay

## 2021-12-22 ENCOUNTER — Ambulatory Visit (INDEPENDENT_AMBULATORY_CARE_PROVIDER_SITE_OTHER): Payer: PPO

## 2021-12-22 ENCOUNTER — Ambulatory Visit: Payer: PPO | Admitting: Family Medicine

## 2021-12-22 VITALS — BP 106/78 | HR 76 | Wt 142.2 lb

## 2021-12-22 DIAGNOSIS — G8929 Other chronic pain: Secondary | ICD-10-CM | POA: Diagnosis not present

## 2021-12-22 DIAGNOSIS — M25511 Pain in right shoulder: Secondary | ICD-10-CM
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
## 2021-12-22 NOTE — Patient Instructions (Addendum)
Nice to meet you today.  I've referred you to [facility] in [place].  Their office will call you to schedule but please let us know if you haven't heard from them in one week regarding scheduling.  Please get an Xray today before you leave.  Follow-up: 6 weeks

## 2021-12-23 ENCOUNTER — Ambulatory Visit: Payer: PPO | Admitting: Family Medicine

## 2021-12-23 NOTE — Progress Notes (Signed)
Shoulder x-ray shows arthritis changes in the main shoulder joint.  Evidence of prior shoulder dislocation is present. The shoulder is not currently dislocated. Please proceed to physical therapy like we talked about in clinic.

## 2021-12-26 DIAGNOSIS — M25511 Pain in right shoulder: Secondary | ICD-10-CM | POA: Diagnosis not present

## 2021-12-26 DIAGNOSIS — M25611 Stiffness of right shoulder, not elsewhere classified: Secondary | ICD-10-CM | POA: Diagnosis not present

## 2021-12-29 DIAGNOSIS — M25511 Pain in right shoulder: Secondary | ICD-10-CM | POA: Diagnosis not present

## 2021-12-29 DIAGNOSIS — M25611 Stiffness of right shoulder, not elsewhere classified: Secondary | ICD-10-CM | POA: Diagnosis not present

## 2022-01-01 DIAGNOSIS — M25511 Pain in right shoulder: Secondary | ICD-10-CM | POA: Diagnosis not present

## 2022-01-01 DIAGNOSIS — M25611 Stiffness of right shoulder, not elsewhere classified: Secondary | ICD-10-CM | POA: Diagnosis not present

## 2022-01-05 DIAGNOSIS — M25511 Pain in right shoulder: Secondary | ICD-10-CM | POA: Diagnosis not present

## 2022-01-05 DIAGNOSIS — M25611 Stiffness of right shoulder, not elsewhere classified: Secondary | ICD-10-CM | POA: Diagnosis not present

## 2022-01-06 DIAGNOSIS — F5101 Primary insomnia: Secondary | ICD-10-CM | POA: Diagnosis not present

## 2022-01-08 DIAGNOSIS — M25611 Stiffness of right shoulder, not elsewhere classified: Secondary | ICD-10-CM | POA: Diagnosis not present

## 2022-01-08 DIAGNOSIS — M25511 Pain in right shoulder: Secondary | ICD-10-CM | POA: Diagnosis not present

## 2022-01-12 DIAGNOSIS — M25611 Stiffness of right shoulder, not elsewhere classified: Secondary | ICD-10-CM | POA: Diagnosis not present

## 2022-01-12 DIAGNOSIS — M25511 Pain in right shoulder: Secondary | ICD-10-CM | POA: Diagnosis not present

## 2022-01-15 DIAGNOSIS — M75121 Complete rotator cuff tear or rupture of right shoulder, not specified as traumatic: Secondary | ICD-10-CM | POA: Diagnosis not present

## 2022-01-19 DIAGNOSIS — M75121 Complete rotator cuff tear or rupture of right shoulder, not specified as traumatic: Secondary | ICD-10-CM | POA: Diagnosis not present

## 2022-01-22 DIAGNOSIS — M75121 Complete rotator cuff tear or rupture of right shoulder, not specified as traumatic: Secondary | ICD-10-CM | POA: Diagnosis not present

## 2022-01-27 DIAGNOSIS — K529 Noninfective gastroenteritis and colitis, unspecified: Secondary | ICD-10-CM | POA: Diagnosis not present

## 2022-01-27 DIAGNOSIS — J209 Acute bronchitis, unspecified: Secondary | ICD-10-CM | POA: Diagnosis not present

## 2022-01-27 DIAGNOSIS — B349 Viral infection, unspecified: Secondary | ICD-10-CM | POA: Diagnosis not present

## 2022-01-30 DIAGNOSIS — R051 Acute cough: Secondary | ICD-10-CM | POA: Diagnosis not present

## 2022-01-30 DIAGNOSIS — J209 Acute bronchitis, unspecified: Secondary | ICD-10-CM | POA: Diagnosis not present

## 2022-02-02 ENCOUNTER — Ambulatory Visit: Payer: PPO | Admitting: Family Medicine
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
## 2022-02-10 DIAGNOSIS — R42 Dizziness and giddiness: Secondary | ICD-10-CM | POA: Diagnosis not present

## 2022-02-10 DIAGNOSIS — R051 Acute cough: Secondary | ICD-10-CM | POA: Diagnosis not present

## 2022-02-10 DIAGNOSIS — R110 Nausea: Secondary | ICD-10-CM | POA: Diagnosis not present

## 2022-02-10 DIAGNOSIS — I10 Essential (primary) hypertension: Secondary | ICD-10-CM | POA: Diagnosis not present

## 2022-02-10 DIAGNOSIS — Z888 Allergy status to other drugs, medicaments and biological substances status: Secondary | ICD-10-CM | POA: Diagnosis not present

## 2022-02-10 DIAGNOSIS — R531 Weakness: Secondary | ICD-10-CM | POA: Diagnosis not present

## 2022-02-10 DIAGNOSIS — R5381 Other malaise: Secondary | ICD-10-CM | POA: Diagnosis not present

## 2022-02-10 DIAGNOSIS — Z8616 Personal history of COVID-19: Secondary | ICD-10-CM | POA: Diagnosis not present

## 2022-02-10 DIAGNOSIS — Z882 Allergy status to sulfonamides status: Secondary | ICD-10-CM | POA: Diagnosis not present

## 2022-02-10 DIAGNOSIS — Z880 Allergy status to penicillin: Secondary | ICD-10-CM | POA: Diagnosis not present

## 2022-02-10 DIAGNOSIS — R059 Cough, unspecified: Secondary | ICD-10-CM | POA: Diagnosis not present

## 2022-02-10 DIAGNOSIS — R5383 Other fatigue: Secondary | ICD-10-CM | POA: Diagnosis not present

## 2022-02-10 DIAGNOSIS — Z87891 Personal history of nicotine dependence: Secondary | ICD-10-CM | POA: Diagnosis not present

## 2022-02-10 DIAGNOSIS — I700 Atherosclerosis of aorta: Secondary | ICD-10-CM | POA: Diagnosis not present

## 2022-02-11 DIAGNOSIS — R531 Weakness: Secondary | ICD-10-CM | POA: Diagnosis not present

## 2022-02-11 DIAGNOSIS — R5383 Other fatigue: Secondary | ICD-10-CM | POA: Diagnosis not present

## 2022-04-07 DIAGNOSIS — R69 Illness, unspecified: Secondary | ICD-10-CM | POA: Diagnosis not present

## 2022-04-07 DIAGNOSIS — R413 Other amnesia: Secondary | ICD-10-CM | POA: Diagnosis not present

## 2022-04-08 ENCOUNTER — Other Ambulatory Visit: Payer: Self-pay | Admitting: Internal Medicine

## 2022-04-08 ENCOUNTER — Other Ambulatory Visit (HOSPITAL_COMMUNITY): Payer: Self-pay | Admitting: Internal Medicine

## 2022-04-08 DIAGNOSIS — R413 Other amnesia: Secondary | ICD-10-CM

## 2022-05-11 DIAGNOSIS — D519 Vitamin B12 deficiency anemia, unspecified: Secondary | ICD-10-CM | POA: Diagnosis not present

## 2022-05-16 DEATH — deceased

## 2022-06-23 DIAGNOSIS — Z1231 Encounter for screening mammogram for malignant neoplasm of breast: Secondary | ICD-10-CM | POA: Diagnosis not present

## 2022-06-23 DIAGNOSIS — Z79890 Hormone replacement therapy (postmenopausal): Secondary | ICD-10-CM | POA: Diagnosis not present

## 2022-06-23 DIAGNOSIS — Z01419 Encounter for gynecological examination (general) (routine) without abnormal findings: Secondary | ICD-10-CM | POA: Diagnosis not present

## 2022-07-22 DIAGNOSIS — J209 Acute bronchitis, unspecified: Secondary | ICD-10-CM | POA: Diagnosis not present

## 2022-08-04 DIAGNOSIS — L720 Epidermal cyst: Secondary | ICD-10-CM | POA: Diagnosis not present

## 2022-08-06 DIAGNOSIS — R202 Paresthesia of skin: Secondary | ICD-10-CM | POA: Diagnosis not present

## 2022-08-10 DIAGNOSIS — K219 Gastro-esophageal reflux disease without esophagitis: Secondary | ICD-10-CM | POA: Diagnosis not present

## 2022-08-10 DIAGNOSIS — L720 Epidermal cyst: Secondary | ICD-10-CM | POA: Diagnosis not present

## 2022-08-10 DIAGNOSIS — R052 Subacute cough: Secondary | ICD-10-CM | POA: Diagnosis not present

## 2022-08-12 DIAGNOSIS — L720 Epidermal cyst: Secondary | ICD-10-CM | POA: Diagnosis not present

## 2022-08-21 DIAGNOSIS — L720 Epidermal cyst: Secondary | ICD-10-CM | POA: Diagnosis not present

## 2022-09-07 DIAGNOSIS — L720 Epidermal cyst: Secondary | ICD-10-CM | POA: Diagnosis not present

## 2022-09-09 DIAGNOSIS — Z01818 Encounter for other preprocedural examination: Secondary | ICD-10-CM | POA: Diagnosis not present

## 2022-09-09 DIAGNOSIS — Z01812 Encounter for preprocedural laboratory examination: Secondary | ICD-10-CM | POA: Diagnosis not present

## 2022-09-09 DIAGNOSIS — L720 Epidermal cyst: Secondary | ICD-10-CM | POA: Diagnosis not present

## 2022-09-09 DIAGNOSIS — Z01810 Encounter for preprocedural cardiovascular examination: Secondary | ICD-10-CM | POA: Diagnosis not present

## 2022-09-10 DIAGNOSIS — N6001 Solitary cyst of right breast: Secondary | ICD-10-CM | POA: Diagnosis not present

## 2022-09-10 DIAGNOSIS — L720 Epidermal cyst: Secondary | ICD-10-CM | POA: Diagnosis not present

## 2022-10-29 DIAGNOSIS — E032 Hypothyroidism due to medicaments and other exogenous substances: Secondary | ICD-10-CM | POA: Diagnosis not present

## 2022-10-29 DIAGNOSIS — I10 Essential (primary) hypertension: Secondary | ICD-10-CM | POA: Diagnosis not present

## 2022-11-05 DIAGNOSIS — J45901 Unspecified asthma with (acute) exacerbation: Secondary | ICD-10-CM | POA: Diagnosis not present

## 2022-11-05 DIAGNOSIS — I10 Essential (primary) hypertension: Secondary | ICD-10-CM | POA: Diagnosis not present

## 2022-11-05 DIAGNOSIS — E538 Deficiency of other specified B group vitamins: Secondary | ICD-10-CM | POA: Diagnosis not present

## 2022-11-05 DIAGNOSIS — K219 Gastro-esophageal reflux disease without esophagitis: Secondary | ICD-10-CM | POA: Diagnosis not present

## 2022-11-05 DIAGNOSIS — E039 Hypothyroidism, unspecified: Secondary | ICD-10-CM | POA: Diagnosis not present

## 2022-11-05 DIAGNOSIS — Z0000 Encounter for general adult medical examination without abnormal findings: Secondary | ICD-10-CM | POA: Diagnosis not present

## 2022-11-05 DIAGNOSIS — R69 Illness, unspecified: Secondary | ICD-10-CM | POA: Diagnosis not present

## 2022-11-05 DIAGNOSIS — Z23 Encounter for immunization: Secondary | ICD-10-CM | POA: Diagnosis not present

## 2022-11-05 DIAGNOSIS — M810 Age-related osteoporosis without current pathological fracture: Secondary | ICD-10-CM | POA: Diagnosis not present

## 2022-11-05 DIAGNOSIS — N39498 Other specified urinary incontinence: Secondary | ICD-10-CM | POA: Diagnosis not present

## 2023-01-27 DIAGNOSIS — M810 Age-related osteoporosis without current pathological fracture: Secondary | ICD-10-CM | POA: Diagnosis not present

## 2023-03-23 DIAGNOSIS — L723 Sebaceous cyst: Secondary | ICD-10-CM | POA: Diagnosis not present

## 2023-03-23 DIAGNOSIS — L089 Local infection of the skin and subcutaneous tissue, unspecified: Secondary | ICD-10-CM | POA: Diagnosis not present

## 2023-03-31 DIAGNOSIS — L720 Epidermal cyst: Secondary | ICD-10-CM | POA: Diagnosis not present

## 2023-03-31 DIAGNOSIS — L820 Inflamed seborrheic keratosis: Secondary | ICD-10-CM | POA: Diagnosis not present

## 2023-04-05 DIAGNOSIS — L720 Epidermal cyst: Secondary | ICD-10-CM | POA: Diagnosis not present

## 2023-04-05 DIAGNOSIS — L989 Disorder of the skin and subcutaneous tissue, unspecified: Secondary | ICD-10-CM | POA: Diagnosis not present

## 2023-04-17 DIAGNOSIS — I700 Atherosclerosis of aorta: Secondary | ICD-10-CM | POA: Diagnosis not present

## 2023-04-17 DIAGNOSIS — G4489 Other headache syndrome: Secondary | ICD-10-CM | POA: Diagnosis not present

## 2023-04-17 DIAGNOSIS — R519 Headache, unspecified: Secondary | ICD-10-CM | POA: Diagnosis not present

## 2023-04-17 DIAGNOSIS — M47812 Spondylosis without myelopathy or radiculopathy, cervical region: Secondary | ICD-10-CM | POA: Diagnosis not present

## 2023-04-17 DIAGNOSIS — R030 Elevated blood-pressure reading, without diagnosis of hypertension: Secondary | ICD-10-CM | POA: Diagnosis not present

## 2023-04-17 DIAGNOSIS — I10 Essential (primary) hypertension: Secondary | ICD-10-CM | POA: Diagnosis not present

## 2023-04-17 DIAGNOSIS — R110 Nausea: Secondary | ICD-10-CM | POA: Diagnosis not present

## 2023-04-17 DIAGNOSIS — Z743 Need for continuous supervision: Secondary | ICD-10-CM | POA: Diagnosis not present

## 2023-04-17 DIAGNOSIS — R42 Dizziness and giddiness: Secondary | ICD-10-CM | POA: Diagnosis not present

## 2023-04-17 DIAGNOSIS — I6523 Occlusion and stenosis of bilateral carotid arteries: Secondary | ICD-10-CM | POA: Diagnosis not present

## 2023-04-17 DIAGNOSIS — R9082 White matter disease, unspecified: Secondary | ICD-10-CM | POA: Diagnosis not present

## 2023-04-19 DIAGNOSIS — R42 Dizziness and giddiness: Secondary | ICD-10-CM | POA: Diagnosis not present

## 2023-04-21 DIAGNOSIS — E673 Hypervitaminosis D: Secondary | ICD-10-CM | POA: Diagnosis not present

## 2023-04-21 DIAGNOSIS — I10 Essential (primary) hypertension: Secondary | ICD-10-CM | POA: Diagnosis not present

## 2023-04-22 DIAGNOSIS — L989 Disorder of the skin and subcutaneous tissue, unspecified: Secondary | ICD-10-CM | POA: Diagnosis not present

## 2023-04-22 DIAGNOSIS — L720 Epidermal cyst: Secondary | ICD-10-CM | POA: Diagnosis not present

## 2023-04-29 DIAGNOSIS — T8131XA Disruption of external operation (surgical) wound, not elsewhere classified, initial encounter: Secondary | ICD-10-CM | POA: Diagnosis not present

## 2023-04-29 DIAGNOSIS — Z4801 Encounter for change or removal of surgical wound dressing: Secondary | ICD-10-CM | POA: Diagnosis not present

## 2023-04-29 DIAGNOSIS — Y848 Other medical procedures as the cause of abnormal reaction of the patient, or of later complication, without mention of misadventure at the time of the procedure: Secondary | ICD-10-CM | POA: Diagnosis not present

## 2023-04-30 DIAGNOSIS — L720 Epidermal cyst: Secondary | ICD-10-CM | POA: Diagnosis not present

## 2023-05-06 DIAGNOSIS — I10 Essential (primary) hypertension: Secondary | ICD-10-CM | POA: Diagnosis not present

## 2023-05-06 DIAGNOSIS — R410 Disorientation, unspecified: Secondary | ICD-10-CM | POA: Diagnosis not present

## 2023-05-07 DIAGNOSIS — L720 Epidermal cyst: Secondary | ICD-10-CM | POA: Diagnosis not present

## 2023-05-07 DIAGNOSIS — L989 Disorder of the skin and subcutaneous tissue, unspecified: Secondary | ICD-10-CM | POA: Diagnosis not present

## 2023-05-24 DIAGNOSIS — R3129 Other microscopic hematuria: Secondary | ICD-10-CM | POA: Diagnosis not present

## 2023-05-24 DIAGNOSIS — R053 Chronic cough: Secondary | ICD-10-CM | POA: Diagnosis not present

## 2023-05-24 DIAGNOSIS — M25511 Pain in right shoulder: Secondary | ICD-10-CM | POA: Diagnosis not present

## 2023-05-24 DIAGNOSIS — R3915 Urgency of urination: Secondary | ICD-10-CM | POA: Diagnosis not present

## 2023-05-24 DIAGNOSIS — I10 Essential (primary) hypertension: Secondary | ICD-10-CM | POA: Diagnosis not present

## 2023-05-24 DIAGNOSIS — G8929 Other chronic pain: Secondary | ICD-10-CM | POA: Diagnosis not present

## 2023-05-29 IMAGING — DX DG SHOULDER 2+V*R*
3 series · 3 of 3 positions shown · non-contrast
Comparison: None.

CLINICAL DATA: Right shoulder pain, no known injury, initial
encounter

EXAM:
RIGHT SHOULDER - 2+ VIEW

[shoulder ap (1 of 2)]
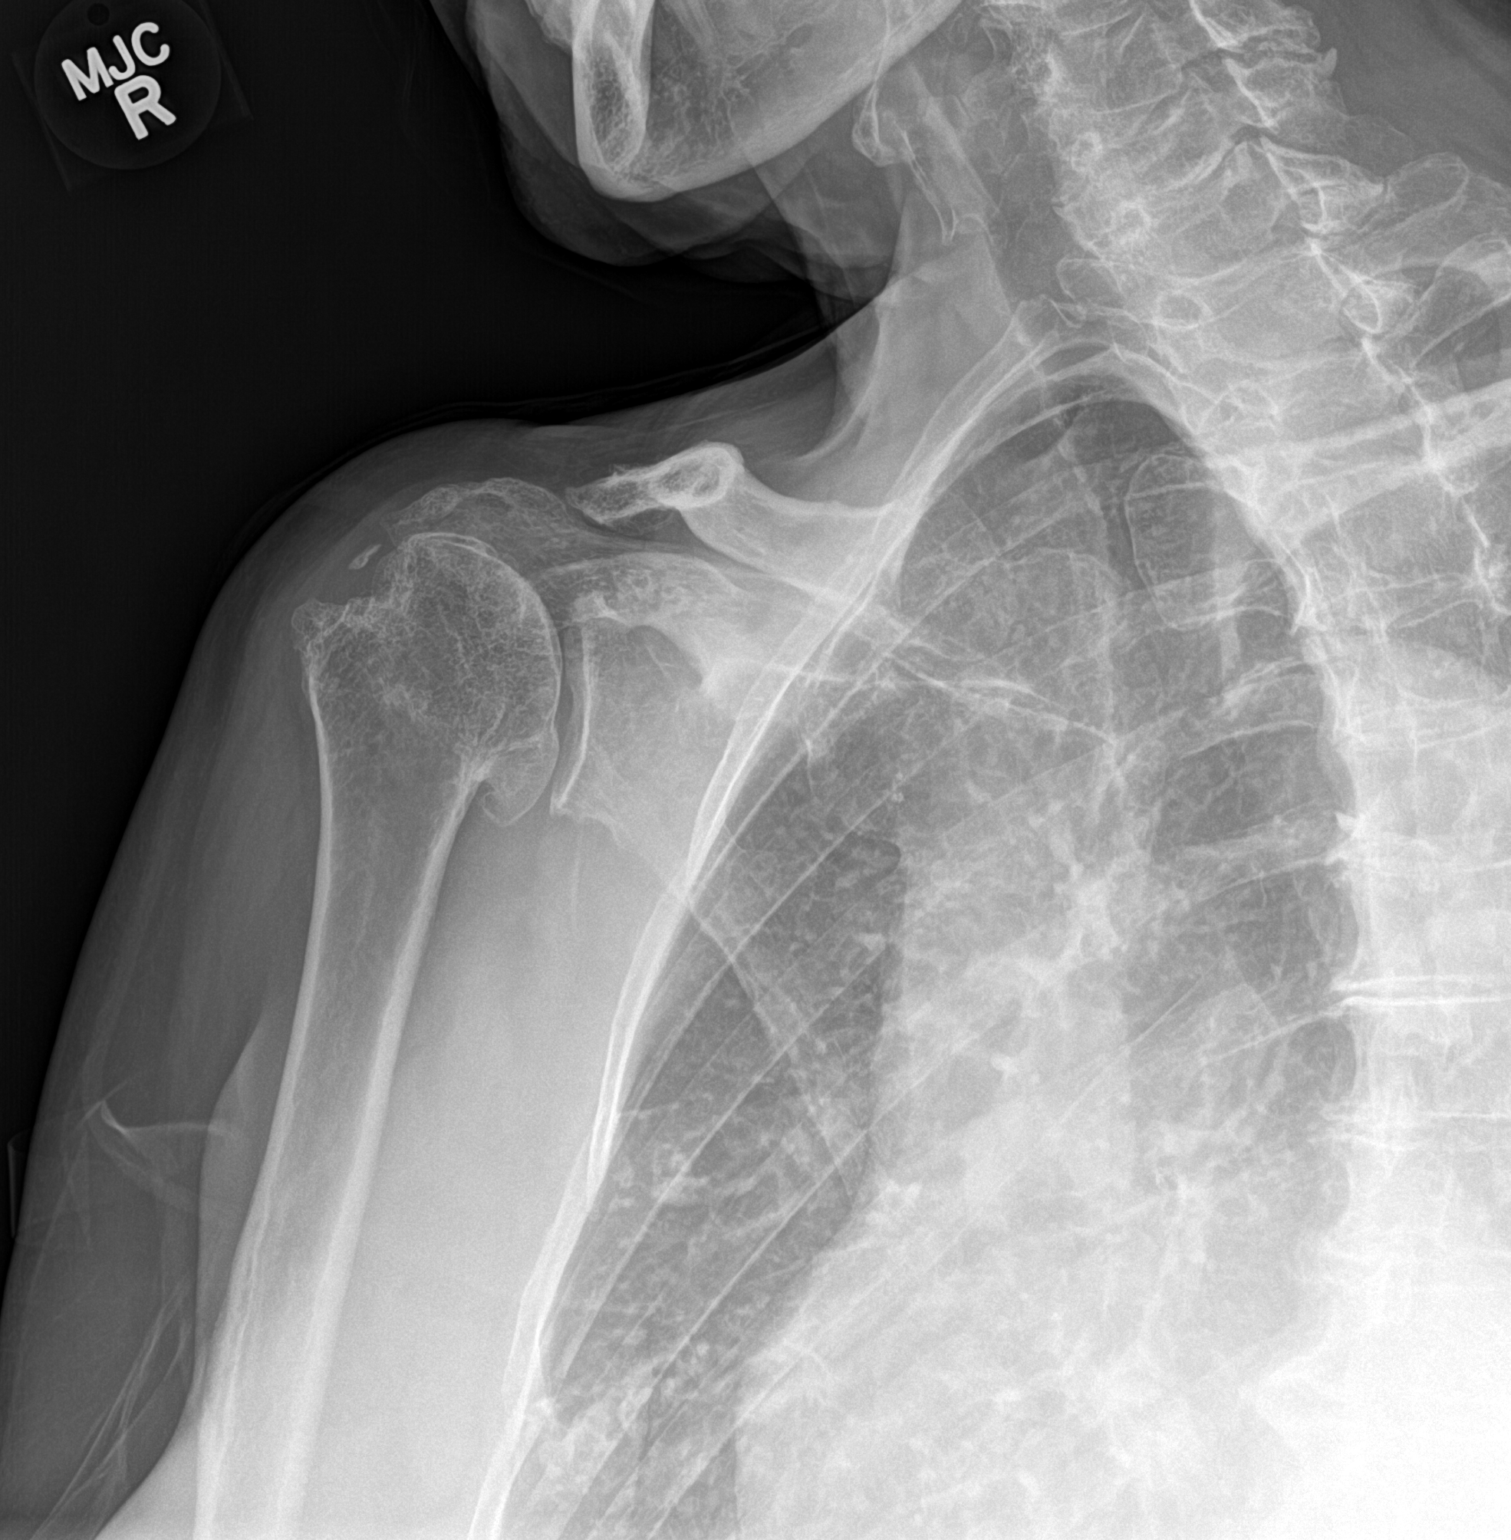

[shoulder ap (2 of 2)]
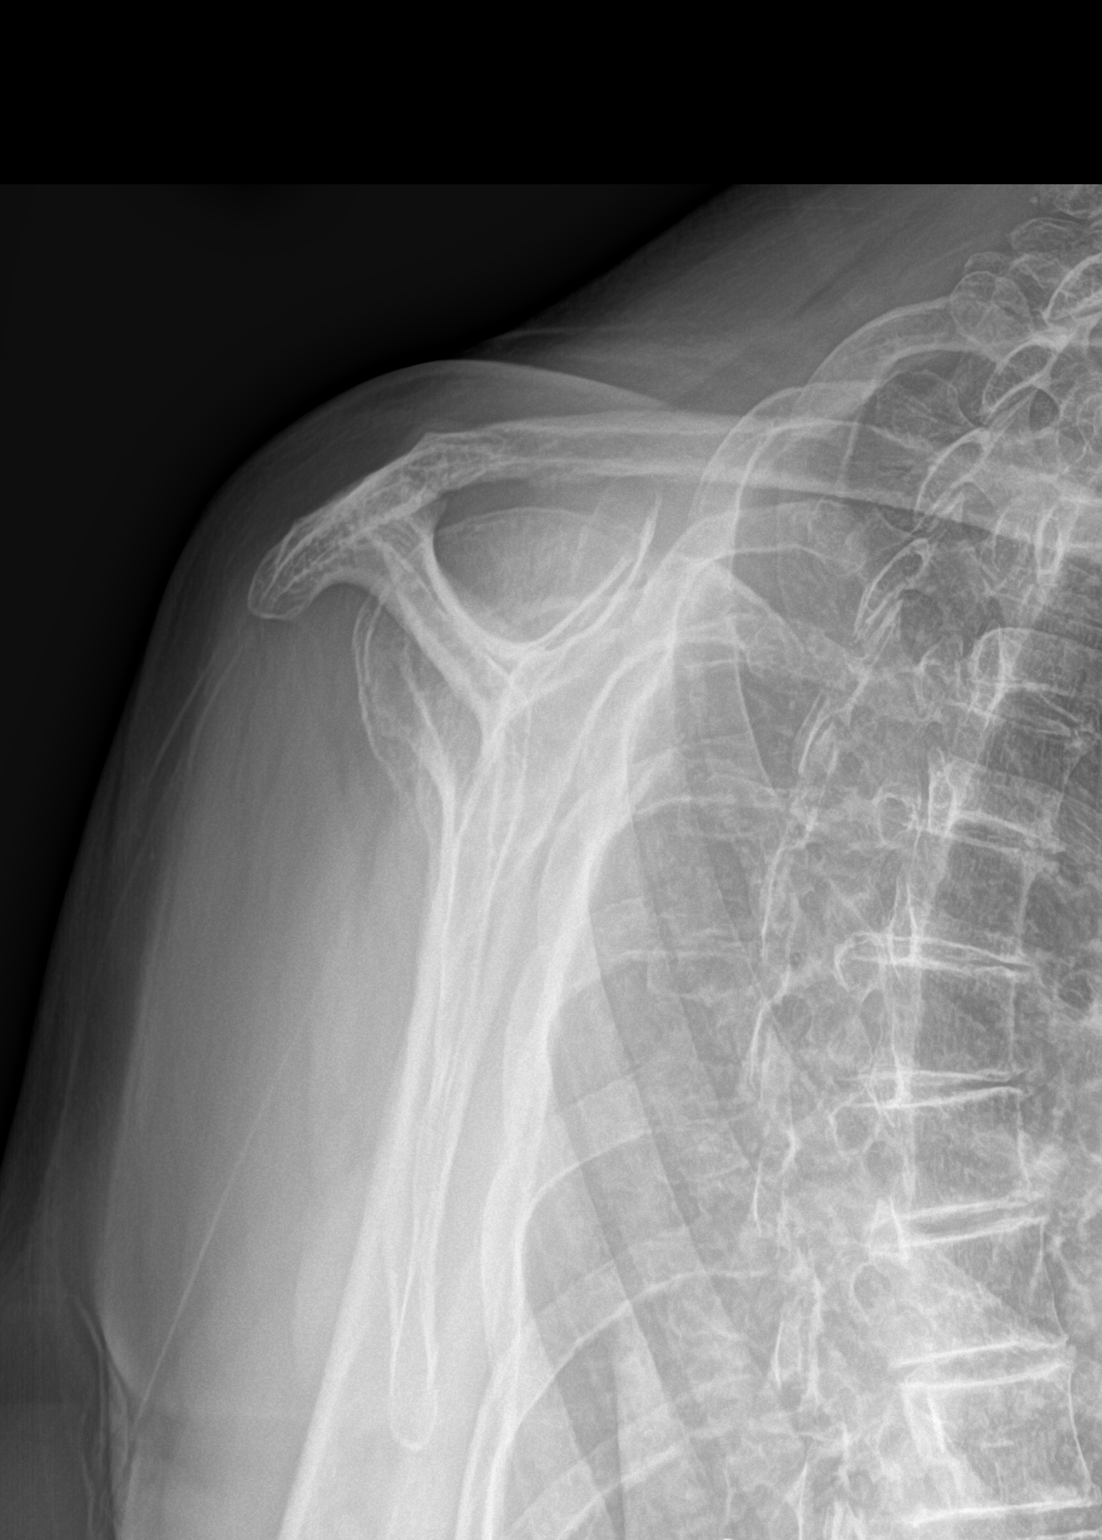

[shoulder axial]
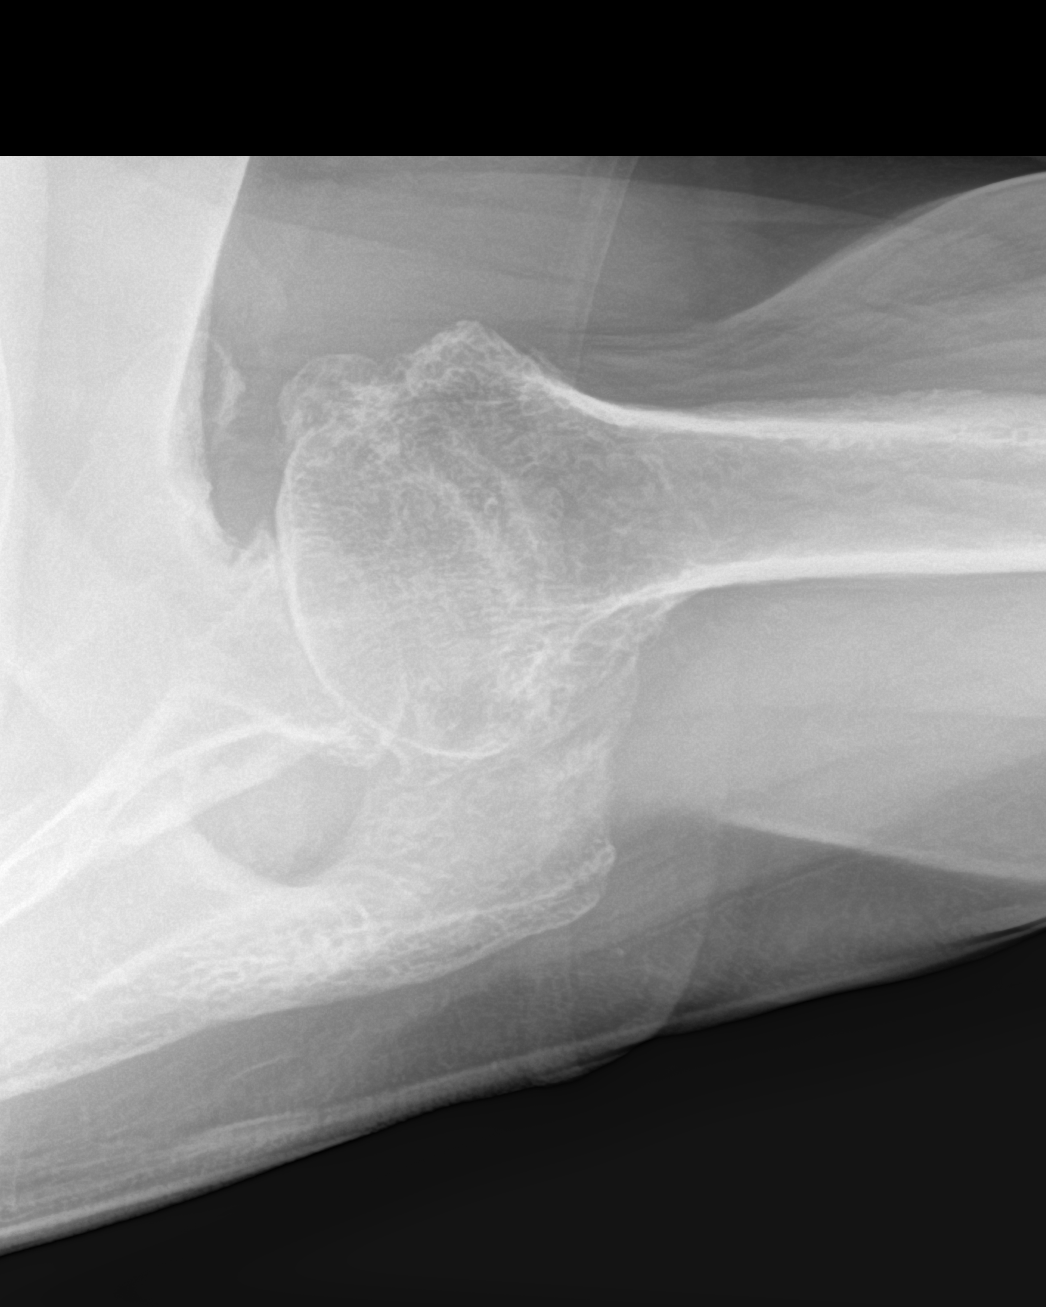

[3 of 3 positions shown; findings below may reference images not displayed]

FINDINGS: Degenerative changes of the glenohumeral joint are noted. Changes
consistent with Hill-Sachs deformity are noted with a small
calcification adjacent to the defect which may represent a chronic
fragment. Degenerative changes of the acromioclavicular joint are
seen. Underlying bony thorax appears within normal limits.
IMPRESSION: Degenerative changes and prior Hill-Sachs deformity related to prior
dislocation.

## 2023-05-31 DIAGNOSIS — L989 Disorder of the skin and subcutaneous tissue, unspecified: Secondary | ICD-10-CM | POA: Diagnosis not present

## 2023-07-13 DIAGNOSIS — N3941 Urge incontinence: Secondary | ICD-10-CM | POA: Diagnosis not present

## 2023-07-13 DIAGNOSIS — Z809 Family history of malignant neoplasm, unspecified: Secondary | ICD-10-CM | POA: Diagnosis not present

## 2023-07-13 DIAGNOSIS — Z79899 Other long term (current) drug therapy: Secondary | ICD-10-CM | POA: Diagnosis not present

## 2023-07-13 DIAGNOSIS — Z823 Family history of stroke: Secondary | ICD-10-CM | POA: Diagnosis not present

## 2023-07-13 DIAGNOSIS — I10 Essential (primary) hypertension: Secondary | ICD-10-CM | POA: Diagnosis not present

## 2023-07-13 DIAGNOSIS — I672 Cerebral atherosclerosis: Secondary | ICD-10-CM | POA: Diagnosis not present

## 2023-07-13 DIAGNOSIS — Z9181 History of falling: Secondary | ICD-10-CM | POA: Diagnosis not present

## 2023-07-13 DIAGNOSIS — F419 Anxiety disorder, unspecified: Secondary | ICD-10-CM | POA: Diagnosis not present

## 2023-07-13 DIAGNOSIS — R079 Chest pain, unspecified: Secondary | ICD-10-CM | POA: Diagnosis not present

## 2023-07-13 DIAGNOSIS — Z7983 Long term (current) use of bisphosphonates: Secondary | ICD-10-CM | POA: Diagnosis not present

## 2023-07-13 DIAGNOSIS — M810 Age-related osteoporosis without current pathological fracture: Secondary | ICD-10-CM | POA: Diagnosis not present

## 2023-07-13 DIAGNOSIS — Z8616 Personal history of COVID-19: Secondary | ICD-10-CM | POA: Diagnosis not present

## 2023-07-13 DIAGNOSIS — F329 Major depressive disorder, single episode, unspecified: Secondary | ICD-10-CM | POA: Diagnosis not present

## 2023-07-13 DIAGNOSIS — Z008 Encounter for other general examination: Secondary | ICD-10-CM | POA: Diagnosis not present

## 2023-07-13 DIAGNOSIS — U071 COVID-19: Secondary | ICD-10-CM | POA: Diagnosis not present

## 2023-07-13 DIAGNOSIS — R42 Dizziness and giddiness: Secondary | ICD-10-CM | POA: Diagnosis not present

## 2023-07-13 DIAGNOSIS — Z8249 Family history of ischemic heart disease and other diseases of the circulatory system: Secondary | ICD-10-CM | POA: Diagnosis not present

## 2023-07-13 DIAGNOSIS — I700 Atherosclerosis of aorta: Secondary | ICD-10-CM | POA: Diagnosis not present

## 2023-07-13 DIAGNOSIS — E785 Hyperlipidemia, unspecified: Secondary | ICD-10-CM | POA: Diagnosis not present

## 2023-08-04 DIAGNOSIS — M21621 Bunionette of right foot: Secondary | ICD-10-CM | POA: Diagnosis not present

## 2023-08-04 DIAGNOSIS — M7671 Peroneal tendinitis, right leg: Secondary | ICD-10-CM | POA: Diagnosis not present

## 2023-08-04 DIAGNOSIS — L851 Acquired keratosis [keratoderma] palmaris et plantaris: Secondary | ICD-10-CM | POA: Diagnosis not present

## 2023-08-09 DIAGNOSIS — R42 Dizziness and giddiness: Secondary | ICD-10-CM | POA: Diagnosis not present

## 2023-11-23 DIAGNOSIS — Z1371 Encounter for nonprocreative screening for genetic disease carrier status: Secondary | ICD-10-CM | POA: Diagnosis not present

## 2023-11-23 DIAGNOSIS — H93012 Transient ischemic deafness, left ear: Secondary | ICD-10-CM | POA: Diagnosis not present

## 2023-11-23 DIAGNOSIS — Z822 Family history of deafness and hearing loss: Secondary | ICD-10-CM | POA: Diagnosis not present

## 2023-11-23 DIAGNOSIS — Z1589 Genetic susceptibility to other disease: Secondary | ICD-10-CM | POA: Diagnosis not present

## 2023-11-23 DIAGNOSIS — Z8481 Family history of carrier of genetic disease: Secondary | ICD-10-CM | POA: Diagnosis not present

## 2023-12-16 DIAGNOSIS — Z008 Encounter for other general examination: Secondary | ICD-10-CM | POA: Diagnosis not present

## 2024-01-31 DIAGNOSIS — M25511 Pain in right shoulder: Secondary | ICD-10-CM | POA: Diagnosis not present

## 2024-01-31 DIAGNOSIS — E039 Hypothyroidism, unspecified: Secondary | ICD-10-CM | POA: Diagnosis not present

## 2024-01-31 DIAGNOSIS — M810 Age-related osteoporosis without current pathological fracture: Secondary | ICD-10-CM | POA: Diagnosis not present

## 2024-02-03 DIAGNOSIS — M25511 Pain in right shoulder: Secondary | ICD-10-CM | POA: Diagnosis not present

## 2024-02-07 DIAGNOSIS — M25511 Pain in right shoulder: Secondary | ICD-10-CM | POA: Diagnosis not present

## 2024-02-10 DIAGNOSIS — M25511 Pain in right shoulder: Secondary | ICD-10-CM | POA: Diagnosis not present

## 2024-02-14 DIAGNOSIS — M25511 Pain in right shoulder: Secondary | ICD-10-CM | POA: Diagnosis not present

## 2024-02-17 DIAGNOSIS — M25511 Pain in right shoulder: Secondary | ICD-10-CM | POA: Diagnosis not present

## 2024-02-21 DIAGNOSIS — M25511 Pain in right shoulder: Secondary | ICD-10-CM | POA: Diagnosis not present

## 2024-02-24 DIAGNOSIS — M25511 Pain in right shoulder: Secondary | ICD-10-CM | POA: Diagnosis not present

## 2024-03-02 DIAGNOSIS — M25511 Pain in right shoulder: Secondary | ICD-10-CM | POA: Diagnosis not present

## 2024-03-06 DIAGNOSIS — M25511 Pain in right shoulder: Secondary | ICD-10-CM | POA: Diagnosis not present

## 2024-03-21 DIAGNOSIS — E032 Hypothyroidism due to medicaments and other exogenous substances: Secondary | ICD-10-CM | POA: Diagnosis not present

## 2024-03-23 DIAGNOSIS — L659 Nonscarring hair loss, unspecified: Secondary | ICD-10-CM | POA: Diagnosis not present

## 2024-03-23 DIAGNOSIS — E039 Hypothyroidism, unspecified: Secondary | ICD-10-CM | POA: Diagnosis not present

## 2024-03-27 DIAGNOSIS — M25511 Pain in right shoulder: Secondary | ICD-10-CM | POA: Diagnosis not present

## 2024-03-30 DIAGNOSIS — M25511 Pain in right shoulder: Secondary | ICD-10-CM | POA: Diagnosis not present

## 2024-04-03 DIAGNOSIS — M25511 Pain in right shoulder: Secondary | ICD-10-CM | POA: Diagnosis not present

## 2024-04-06 DIAGNOSIS — M25511 Pain in right shoulder: Secondary | ICD-10-CM | POA: Diagnosis not present

## 2024-04-13 DIAGNOSIS — M25511 Pain in right shoulder: Secondary | ICD-10-CM | POA: Diagnosis not present
